# Patient Record
Sex: Male | Born: 1975
Health system: Southern US, Community
[De-identification: ages and names within clinical notes are randomized; demographics above are authoritative.]

## PROBLEM LIST (undated history)

## (undated) DIAGNOSIS — L988 Other specified disorders of the skin and subcutaneous tissue: Secondary | ICD-10-CM

## (undated) DIAGNOSIS — G8929 Other chronic pain: Secondary | ICD-10-CM

## (undated) DIAGNOSIS — K5790 Diverticulosis of intestine, part unspecified, without perforation or abscess without bleeding: Secondary | ICD-10-CM

## (undated) DIAGNOSIS — M542 Cervicalgia: Secondary | ICD-10-CM

## (undated) DIAGNOSIS — E785 Hyperlipidemia, unspecified: Secondary | ICD-10-CM

## (undated) DIAGNOSIS — M549 Dorsalgia, unspecified: Secondary | ICD-10-CM

## (undated) DIAGNOSIS — E669 Obesity, unspecified: Secondary | ICD-10-CM

## (undated) DIAGNOSIS — K5792 Diverticulitis of intestine, part unspecified, without perforation or abscess without bleeding: Secondary | ICD-10-CM

## (undated) DIAGNOSIS — R200 Anesthesia of skin: Secondary | ICD-10-CM

## (undated) DIAGNOSIS — M1990 Unspecified osteoarthritis, unspecified site: Secondary | ICD-10-CM

## (undated) DIAGNOSIS — K219 Gastro-esophageal reflux disease without esophagitis: Secondary | ICD-10-CM

## (undated) DIAGNOSIS — F419 Anxiety disorder, unspecified: Secondary | ICD-10-CM

## (undated) DIAGNOSIS — S3992XA Unspecified injury of lower back, initial encounter: Secondary | ICD-10-CM

## (undated) HISTORY — DX: Anesthesia of skin: R20.0

## (undated) HISTORY — DX: Cervicalgia: M54.2

## (undated) HISTORY — DX: Other specified disorders of the skin and subcutaneous tissue: L98.8

## (undated) HISTORY — DX: Unspecified injury of lower back, initial encounter: S39.92XA

## (undated) HISTORY — DX: Dorsalgia, unspecified: M54.9

## (undated) HISTORY — DX: Hyperlipidemia, unspecified: E78.5

## (undated) HISTORY — DX: Obesity, unspecified: E66.9

## (undated) HISTORY — DX: Gastro-esophageal reflux disease without esophagitis: K21.9

## (undated) HISTORY — DX: Anxiety disorder, unspecified: F41.9

## (undated) HISTORY — DX: Unspecified osteoarthritis, unspecified site: M19.90

## (undated) HISTORY — PX: PILONIDAL CYST EXCISION: SHX744

## (undated) HISTORY — DX: Diverticulosis of intestine, part unspecified, without perforation or abscess without bleeding: K57.90

## (undated) HISTORY — PX: HERNIA REPAIR: SHX51

## (undated) HISTORY — DX: Other chronic pain: G89.29

---
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
## 1995-06-29 HISTORY — PX: CYSTECTOMY: SUR359

## 2000-06-28 HISTORY — PX: WISDOM TOOTH EXTRACTION: SHX21

## 2004-06-25 ENCOUNTER — Emergency Department (HOSPITAL_COMMUNITY): Admission: EM | Admit: 2004-06-25 | Discharge: 2004-06-25 | Payer: Self-pay | Admitting: Emergency Medicine

## 2007-05-18 ENCOUNTER — Encounter: Admission: RE | Admit: 2007-05-18 | Discharge: 2007-05-18 | Payer: Self-pay | Admitting: Internal Medicine

## 2007-06-19 ENCOUNTER — Emergency Department (HOSPITAL_COMMUNITY): Admission: EM | Admit: 2007-06-19 | Discharge: 2007-06-20 | Payer: Self-pay | Admitting: Emergency Medicine

## 2007-06-24 ENCOUNTER — Ambulatory Visit (HOSPITAL_COMMUNITY): Admission: RE | Admit: 2007-06-24 | Discharge: 2007-06-24 | Payer: Self-pay | Admitting: Emergency Medicine

## 2008-04-11 IMAGING — CT CT ABDOMEN W/ CM
2 of 6 series · 16 of 46 positions shown, 18 images · IV contrast (omnipaque)
Comparison: none

CLINICAL DATA: Fever.  Abdominal pain.  
ABDOMEN CT WITH CONTRAST:
TECHNIQUE: Multidetector CT imaging of the abdomen was performed following the standard protocol during bolus administration of intravenous contrast.
Contrast:  100 cc Omnipaque 300
TECHNIQUE: Multidetector CT imaging of the pelvis was performed following the standard protocol during bolus administration of intravenous contrast.

[Series 2: abd_pel 5.0 b40f st · axial · 0.80mm/px · z∈[-534,-108]mm · 13 of 97 slices shown, 15 images]
[im 6/97  soft-tissue]
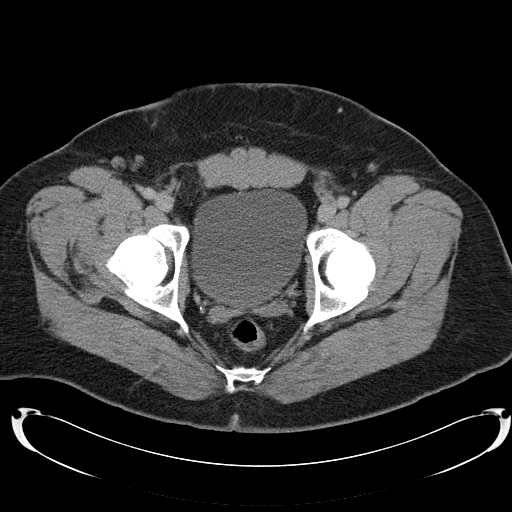
[im 6/97  bone]
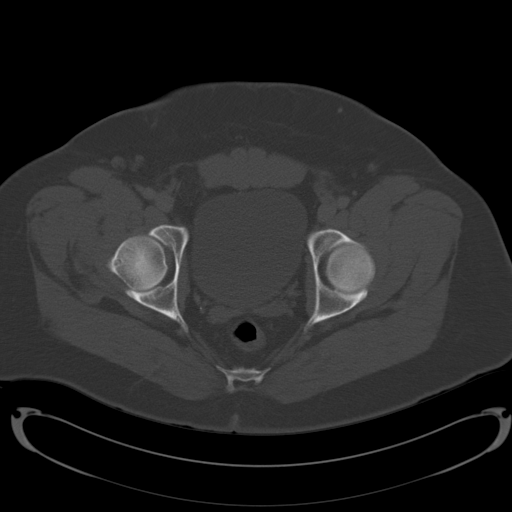
[im 12/97  soft-tissue]
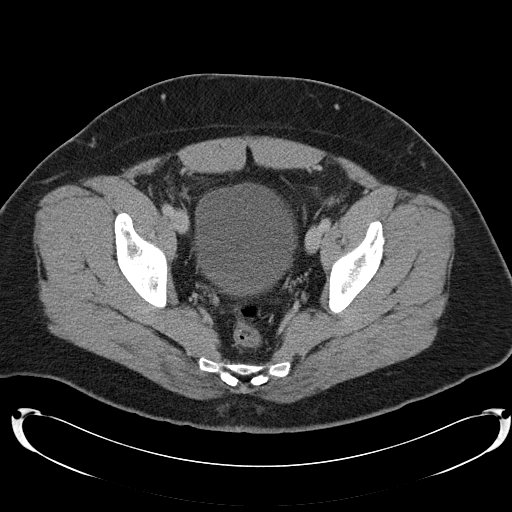
[im 23/97  soft-tissue]
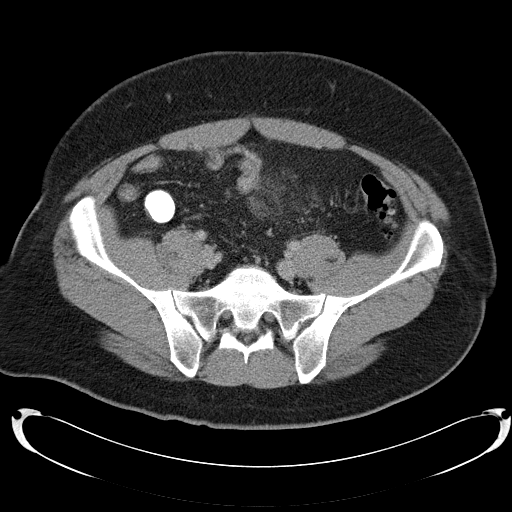
[im 29/97  soft-tissue]
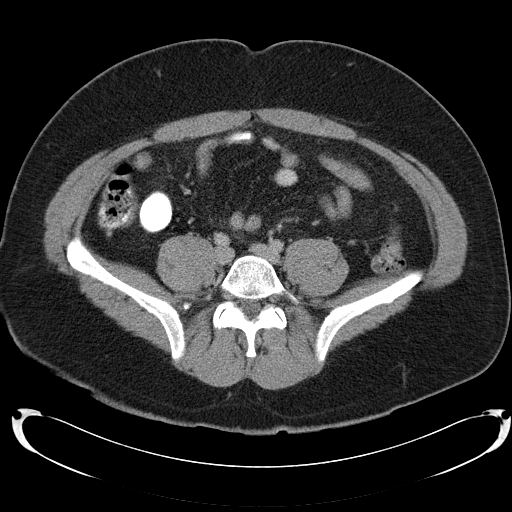
[im 34/97  soft-tissue]
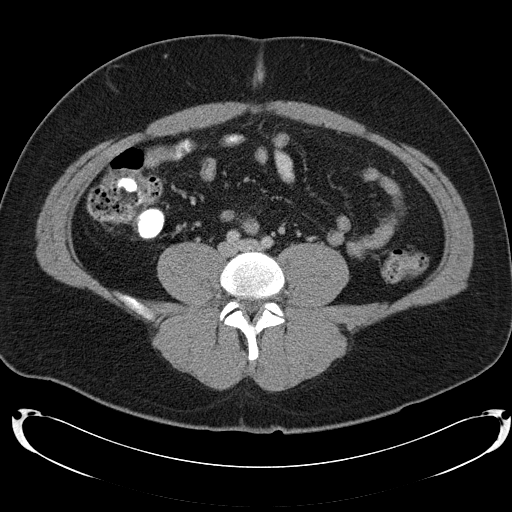
[im 40/97  soft-tissue]
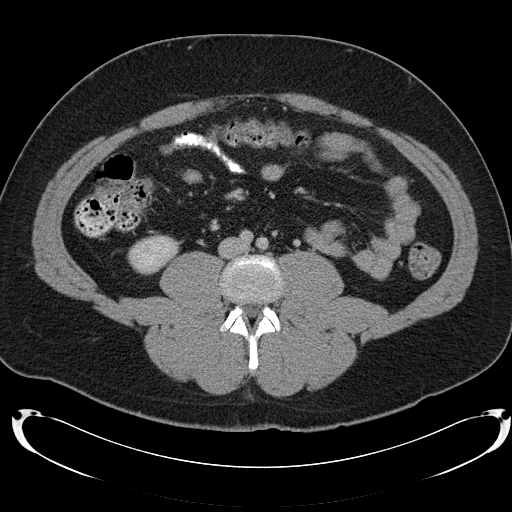
[im 51/97  soft-tissue]
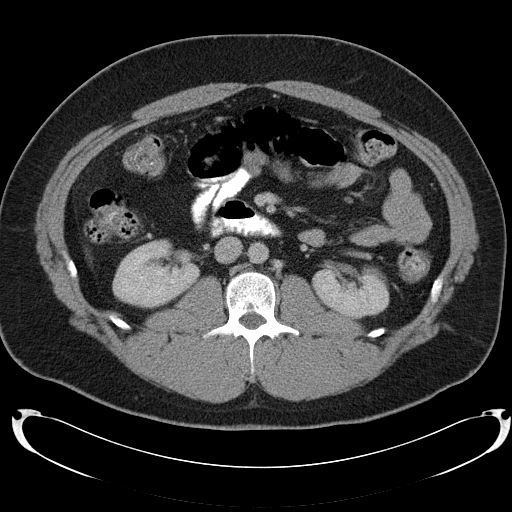
[im 57/97  soft-tissue]
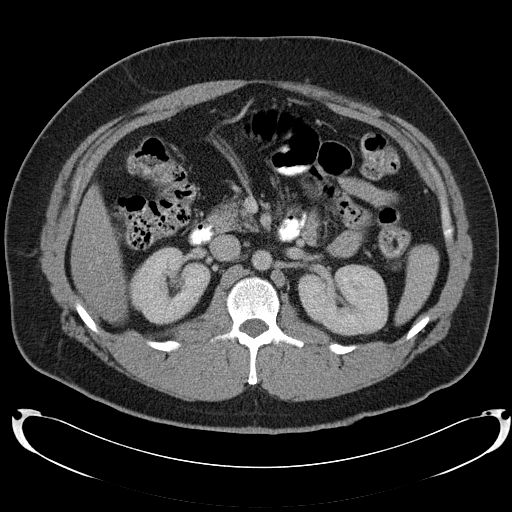
[im 63/97  soft-tissue]
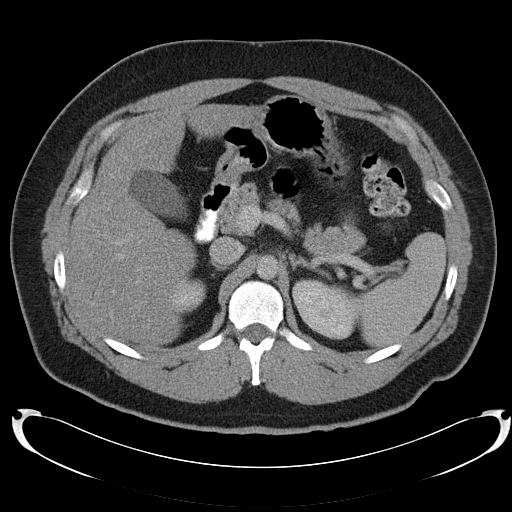
[im 63/97  bone]
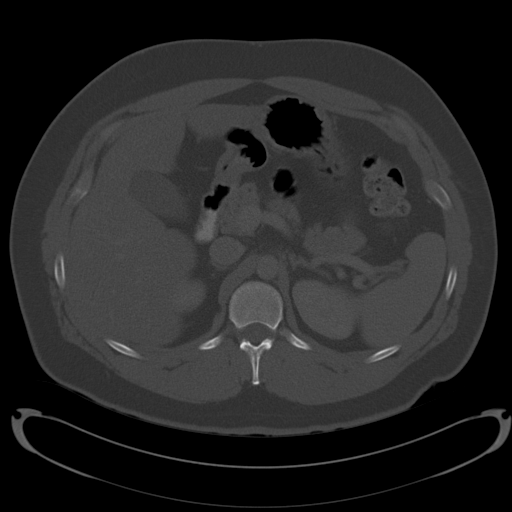
[im 68/97  soft-tissue]
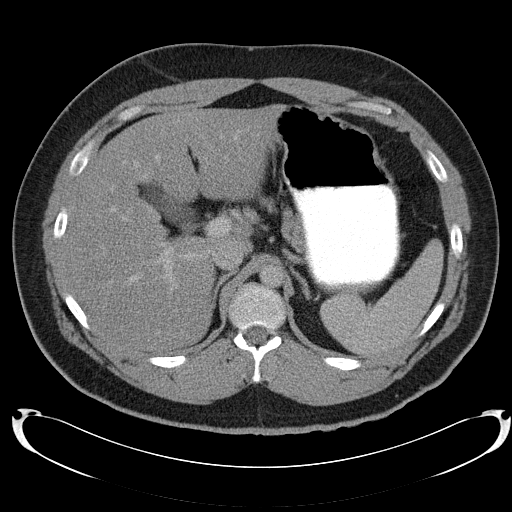
[im 74/97  soft-tissue]
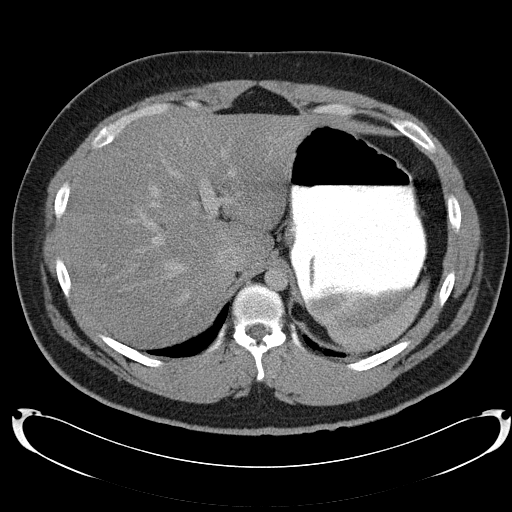
[im 85/97  soft-tissue]
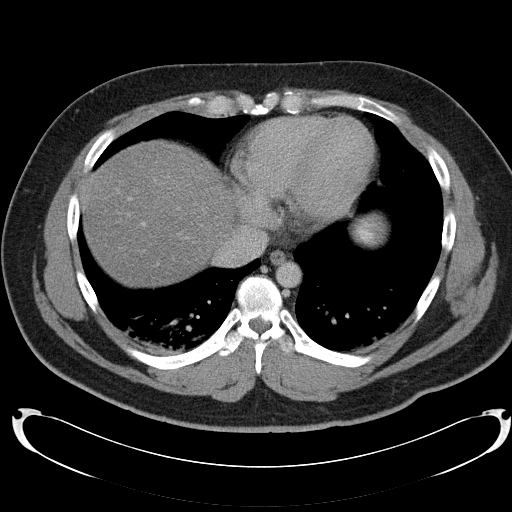
[im 91/97  soft-tissue]
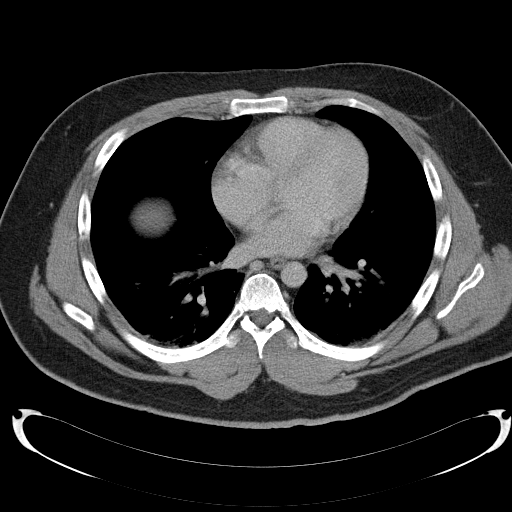

[Series 602: <mpr thick range> · coronal · 0.98mm/px · 3 of 81 slices shown]
[im 27/81  soft-tissue]
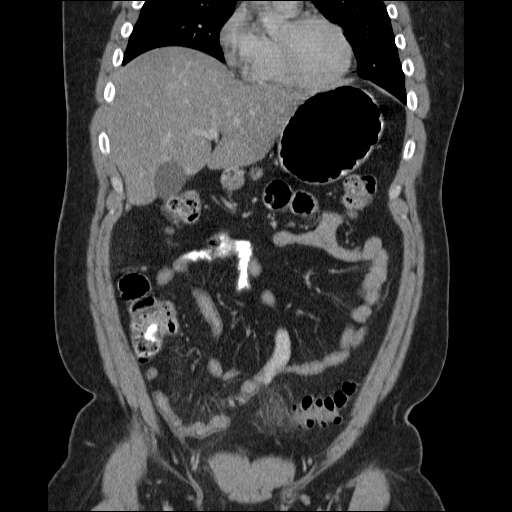
[im 36/81  soft-tissue]
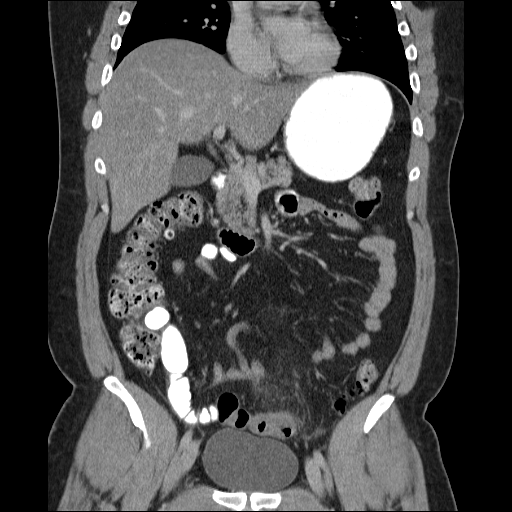
[im 45/81  soft-tissue]
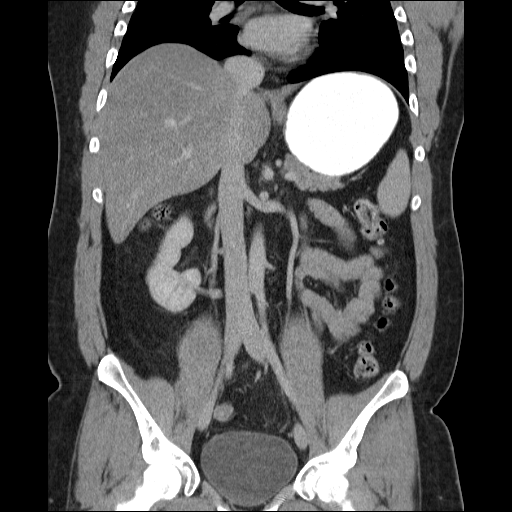

[16 of 46 positions shown; findings below may reference images not displayed]

FINDINGS: There is some dependent atelectatic changes in the lung bases, but no pleural or pericardial effusion.  Liver is diffusely low attenuating compatible with fatty infiltration.  No focal liver lesion.  The gallbladder, biliary tree, pancreas, spleen, adrenal glands, and right kidney appear normal.  There is a small exophytic lesion off the upper pole of the left kidney which measures approximately 1.2 cm in diameter.  Lesion shows Hounsfield unit measurements of 41.7 on early imaging and 58.2 on delayed imaging.  It is worrisome for a small renal neoplasm/renal cell carcinoma.  No regional lymphadenopathy is identified.  Renal vein is patent.  Stomach and small bowel are unremarkable.  No free air or abnormal fluid collection.  No focal bony abnormality.
IMPRESSION: 1.  Lesion in the upper pole of the left kidney as described above.  It may represent an atypical cyst, but renal cell carcinoma cannot be excluded and MRI is recommended for further evaluation. 
2.  Fatty liver.  
PELVIS CT WITH CONTRAST:
FINDINGS: There is thickening of the wall of the sigmoid colon with diverticular disease present.  Infiltration of fat about the sigmoid colon is consistent with diverticulitis.  There is no abscess or perforation.  Appendix is well visualized and normal.   No pelvic fluid or lymphadenopathy.  No focal bony abnormality.
IMPRESSION: Study is positive for diverticulitis without abscess or perforation.  Findings of this report were discussed Dr. Haruo Lutena at [DATE] a.m. on 06/20/07.

## 2010-10-06 ENCOUNTER — Emergency Department (HOSPITAL_COMMUNITY)
Admission: EM | Admit: 2010-10-06 | Discharge: 2010-10-06 | Disposition: A | Discharge status: Home / Self Care | Payer: 59 | Attending: Emergency Medicine | Admitting: Emergency Medicine

## 2010-10-06 DIAGNOSIS — A46 Erysipelas: Secondary | ICD-10-CM | POA: Insufficient documentation

## 2010-10-06 DIAGNOSIS — L0201 Cutaneous abscess of face: Secondary | ICD-10-CM | POA: Insufficient documentation

## 2010-10-06 DIAGNOSIS — L03211 Cellulitis of face: Secondary | ICD-10-CM | POA: Insufficient documentation

## 2011-03-30 ENCOUNTER — Encounter (INDEPENDENT_AMBULATORY_CARE_PROVIDER_SITE_OTHER): Payer: Self-pay | Admitting: Surgery

## 2011-04-01 ENCOUNTER — Ambulatory Visit (INDEPENDENT_AMBULATORY_CARE_PROVIDER_SITE_OTHER): Payer: 59 | Admitting: Surgery

## 2011-04-01 ENCOUNTER — Encounter (INDEPENDENT_AMBULATORY_CARE_PROVIDER_SITE_OTHER): Payer: Self-pay | Admitting: Surgery

## 2011-04-01 VITALS — BP 142/98 | HR 64 | Temp 97.6°F | Resp 20 | Ht 68.5 in | Wt 243.1 lb

## 2011-04-01 DIAGNOSIS — R1032 Left lower quadrant pain: Secondary | ICD-10-CM

## 2011-04-01 NOTE — Progress Notes (Signed)
Chief Complaint  Patient presents with  . Other    new pt eval of [facility]     HPI Brian Riddle is a 35 y.o. male.   HPIReferred by Dr. Pearson Grippe for evaluation of left groin pain. The patient had bilateral inguinal hernia repairs when he was a child. About 2-1/2 weeks ago he was working out in feels that he put to much weight on the machine. He strained his lower back as well as left hip. A few days later he began having some pain in his left groin. The pain resolves when he is supine and is asymptomatic in the mornings. However as the day progresses he starts to have some tenderness in his groin radiating down to the left side of his testicle. He does not feel any bulge or swelling in this area. He denies any problems with bowel movements. He has not been exercising recently due to the pain in his back and hip. Dr. Selena Batten started him on Flagyl prophylactically due to his history of diverticulitis.  Past Medical History  Diagnosis Date  . Hyperlipidemia   . GERD (gastroesophageal reflux disease)   . Arthritis   . Anxiety   . Numbness of hand     questionable carpal tunnel  . Chronic back pain   . Chronic neck pain   . Back injury 1982  . Abdominal pain     groin area around [facility] that comes and goes     Past Surgical History  Procedure Date  . Cystectomy 1997    Rt leg  . Hernia repair [id number]  . Wisdom tooth extraction 2002  . Pilonidal cyst excision [id number]    History reviewed. No pertinent family history.  Social History History  Substance Use Topics  . Smoking status: Current Some Day Smoker -- 0.5 packs/day  . Smokeless tobacco: Never Used  . Alcohol Use: Yes    Allergies  Allergen Reactions  . Penicillins     Hives, and difficulty breathing     Current Outpatient Prescriptions  Medication Sig Dispense Refill  . Ascorbic Acid (VITAMIN C) 100 MG tablet Take 100 mg by mouth daily.        Marland Kitchen aspirin 81 MG tablet Take 81 mg by mouth daily.        . B Complex  Vitamins (B-COMPLEX/B-12 PO) Take by mouth.        Marland Kitchen LORazepam (ATIVAN) 0.5 MG tablet Take 0.5 mg by mouth as needed.        . metroNIDAZOLE (FLAGYL) 500 MG tablet       . Multiple Vitamins-Minerals (MULTIVITAMIN WITH MINERALS) tablet Take 1 tablet by mouth daily.        . Nutritional Supplements (ACID BLOCKERS DEPLETION PO) Take by mouth.        . Acetaminophen (TYLENOL ARTHRITIS PAIN PO) Take by mouth.        . Pseudoephedrine HCl (SUDAFED 12 HOUR PO) Take by mouth.          Review of Systems Review of Systems ROS positive for arthritis in lower back, left hip bursitis Blood pressure 142/98, pulse 64, temperature 97.6 F (36.4 C), resp. rate 20, height 5' 8.5" (1.74 m), weight 243 lb 2 oz (110.281 kg).  Physical Exam Physical Exam WDWN in NAD HEENT:  EOMI, sclera anicteric Neck:  No masses, no thyromegaly Lungs:  CTA bilaterally; normal respiratory effort CV:  Regular rate and rhythm; no murmurs Abd:  +bowel sounds, soft, non-tender, no masses GU:  Healed incisions bilaterally; bilateral descended testes - no testicular masses; no palpable hernias on Valsalva maneuver while standing Ext:  Well-perfused; no edema Skin:  Warm, dry; no sign of jaundice  Data Reviewed None  Assessment    Left groin pain - likely due to muscle strain, but no sign of inguinal hernia    Plan    Rest, NSAID's Return PRN       Keilyn Haggard K. 04/01/2011, 10:46 AM

## 2011-04-01 NOTE — Patient Instructions (Signed)
Avoid strenuous exercise/ heavy lifting for 2-3 weeks.  Use plenty of NSAID's (Aleve, Ibuprofen, or Naprosyn).  Call us back for a follow-up if no improvement.

## 2011-04-02 LAB — COMPREHENSIVE METABOLIC PANEL
ALT: 38
AST: 19
Albumin: 4
Alkaline Phosphatase: 57
BUN: 6
CO2: 24
Calcium: 9.2
Chloride: 106
Creatinine, Ser: 1.04
GFR calc Af Amer: >60
GFR calc non Af Amer: >60
Glucose, Bld: 122 — ABNORMAL HIGH
Potassium: 3.5
Sodium: 139
Total Bilirubin: 1.1
Total Protein: 6.7

## 2011-04-02 LAB — URINALYSIS, ROUTINE W REFLEX MICROSCOPIC
Bilirubin Urine: NEGATIVE
Glucose, UA: NEGATIVE
Hgb urine dipstick: NEGATIVE
Ketones, ur: NEGATIVE
Nitrite: NEGATIVE
Protein, ur: NEGATIVE
Specific Gravity, Urine: 1.018
Urobilinogen, UA: 0.2
pH: 7

## 2011-04-02 LAB — DIFFERENTIAL
Basophils Absolute: 0
Basophils Relative: 0
Eosinophils Absolute: 0 — ABNORMAL LOW
Eosinophils Relative: 0
Lymphocytes Relative: 13
Lymphs Abs: 2
Monocytes Absolute: 1.4 — ABNORMAL HIGH
Monocytes Relative: 9
Neutro Abs: 12 — ABNORMAL HIGH
Neutrophils Relative %: 78 — ABNORMAL HIGH

## 2011-04-02 LAB — CBC
HCT: 43.5
Hemoglobin: 15.4
MCHC: 35.4
MCV: 88.3
Platelets: 205
RBC: 4.93
RDW: 13
WBC: 15.4 — ABNORMAL HIGH

## 2011-05-06 ENCOUNTER — Ambulatory Visit: Payer: 59 | Attending: Internal Medicine | Admitting: Rehabilitative and Restorative Service Providers"

## 2011-05-06 DIAGNOSIS — M545 Low back pain, unspecified: Secondary | ICD-10-CM | POA: Insufficient documentation

## 2011-05-06 DIAGNOSIS — IMO0001 Reserved for inherently not codable concepts without codable children: Secondary | ICD-10-CM | POA: Insufficient documentation

## 2011-05-06 DIAGNOSIS — M2560 Stiffness of unspecified joint, not elsewhere classified: Secondary | ICD-10-CM | POA: Insufficient documentation

## 2011-05-06 DIAGNOSIS — M25559 Pain in unspecified hip: Secondary | ICD-10-CM | POA: Insufficient documentation

## 2011-05-17 ENCOUNTER — Ambulatory Visit: Payer: 59

## 2011-05-19 ENCOUNTER — Ambulatory Visit: Payer: 59

## 2011-05-26 ENCOUNTER — Ambulatory Visit: Payer: 59 | Admitting: Rehabilitative and Restorative Service Providers"

## 2011-05-27 ENCOUNTER — Ambulatory Visit: Payer: 59 | Admitting: Rehabilitative and Restorative Service Providers"

## 2011-06-01 ENCOUNTER — Ambulatory Visit: Payer: 59 | Attending: Internal Medicine | Admitting: Rehabilitative and Restorative Service Providers"

## 2011-06-01 DIAGNOSIS — M2560 Stiffness of unspecified joint, not elsewhere classified: Secondary | ICD-10-CM | POA: Insufficient documentation

## 2011-06-01 DIAGNOSIS — M25559 Pain in unspecified hip: Secondary | ICD-10-CM | POA: Insufficient documentation

## 2011-06-01 DIAGNOSIS — M545 Low back pain, unspecified: Secondary | ICD-10-CM | POA: Insufficient documentation

## 2011-06-01 DIAGNOSIS — IMO0001 Reserved for inherently not codable concepts without codable children: Secondary | ICD-10-CM | POA: Insufficient documentation

## 2011-06-03 ENCOUNTER — Ambulatory Visit: Payer: 59 | Admitting: Rehabilitative and Restorative Service Providers"

## 2011-06-08 ENCOUNTER — Ambulatory Visit: Payer: 59 | Admitting: Rehabilitative and Restorative Service Providers"

## 2011-06-09 ENCOUNTER — Encounter (INDEPENDENT_AMBULATORY_CARE_PROVIDER_SITE_OTHER): Payer: Self-pay | Admitting: Internal Medicine

## 2011-06-10 ENCOUNTER — Ambulatory Visit: Payer: 59 | Admitting: Rehabilitative and Restorative Service Providers"

## 2011-06-16 ENCOUNTER — Ambulatory Visit: Payer: 59 | Admitting: Rehabilitative and Restorative Service Providers"

## 2012-04-03 ENCOUNTER — Emergency Department (HOSPITAL_COMMUNITY): Payer: 59

## 2012-04-03 ENCOUNTER — Emergency Department (HOSPITAL_COMMUNITY)
Admission: EM | Admit: 2012-04-03 | Discharge: 2012-04-03 | Disposition: A | Discharge status: Home / Self Care | Payer: 59 | Attending: Emergency Medicine | Admitting: Emergency Medicine

## 2012-04-03 ENCOUNTER — Encounter (HOSPITAL_COMMUNITY): Payer: Self-pay | Admitting: Emergency Medicine

## 2012-04-03 DIAGNOSIS — G8929 Other chronic pain: Secondary | ICD-10-CM | POA: Insufficient documentation

## 2012-04-03 DIAGNOSIS — Z79899 Other long term (current) drug therapy: Secondary | ICD-10-CM | POA: Insufficient documentation

## 2012-04-03 DIAGNOSIS — K5732 Diverticulitis of large intestine without perforation or abscess without bleeding: Secondary | ICD-10-CM | POA: Insufficient documentation

## 2012-04-03 DIAGNOSIS — K5792 Diverticulitis of intestine, part unspecified, without perforation or abscess without bleeding: Secondary | ICD-10-CM

## 2012-04-03 DIAGNOSIS — R109 Unspecified abdominal pain: Secondary | ICD-10-CM

## 2012-04-03 DIAGNOSIS — K219 Gastro-esophageal reflux disease without esophagitis: Secondary | ICD-10-CM | POA: Insufficient documentation

## 2012-04-03 DIAGNOSIS — Z7982 Long term (current) use of aspirin: Secondary | ICD-10-CM | POA: Insufficient documentation

## 2012-04-03 LAB — COMPREHENSIVE METABOLIC PANEL
ALT: 19 U/L (ref 0–53)
Alkaline Phosphatase: 48 U/L (ref 39–117)
BUN: 11 mg/dL (ref 6–23)
CO2: 25 mEq/L (ref 19–32)
Calcium: 9 mg/dL (ref 8.4–10.5)
Chloride: 102 mEq/L (ref 96–112)
Creatinine, Ser: 0.76 mg/dL (ref 0.50–1.35)
GFR calc Af Amer: >90 mL/min (ref >90)
GFR calc non Af Amer: >90 mL/min (ref >90)
Glucose, Bld: 126 mg/dL — ABNORMAL HIGH (ref 70–99)
Potassium: 3.9 mEq/L (ref 3.5–5.1)
Sodium: 138 mEq/L (ref 135–145)
Total Bilirubin: 1 mg/dL (ref 0.3–1.2)
Total Protein: 7.3 g/dL (ref 6.0–8.3)

## 2012-04-03 LAB — CBC WITH DIFFERENTIAL/PLATELET
Basophils Absolute: 0 10*3/uL (ref 0.0–0.1)
Eosinophils Absolute: 0.1 10*3/uL (ref 0.0–0.7)
Eosinophils Relative: 0 % (ref 0–5)
HCT: 44.1 % (ref 39.0–52.0)
Hemoglobin: 15.5 g/dL (ref 13.0–17.0)
Lymphocytes Relative: 9 % — ABNORMAL LOW (ref 12–46)
Lymphs Abs: 1.4 10*3/uL (ref 0.7–4.0)
MCH: 31.6 pg (ref 26.0–34.0)
MCV: 90 fL (ref 78.0–100.0)
Monocytes Absolute: 1.3 10*3/uL — ABNORMAL HIGH (ref 0.1–1.0)
Monocytes Relative: 9 % (ref 3–12)
Platelets: 172 10*3/uL (ref 150–400)
RBC: 4.9 MIL/uL (ref 4.22–5.81)
RDW: 12.8 % (ref 11.5–15.5)
WBC: 14.6 10*3/uL — ABNORMAL HIGH (ref 4.0–10.5)

## 2012-04-03 LAB — URINALYSIS, MICROSCOPIC ONLY
Bilirubin Urine: NEGATIVE
Glucose, UA: NEGATIVE mg/dL
Hgb urine dipstick: NEGATIVE
Ketones, ur: NEGATIVE mg/dL
Nitrite: NEGATIVE
Protein, ur: NEGATIVE mg/dL
Urobilinogen, UA: 0.2 mg/dL (ref 0.0–1.0)
pH: 6 (ref 5.0–8.0)

## 2012-04-03 MED ORDER — ACETAMINOPHEN 500 MG PO TABS
1000.0000 mg | ORAL_TABLET | Freq: Once | ORAL | Status: AC
  Administered 2012-04-03: 1000 mg via ORAL
  Filled 2012-04-03: qty 2

## 2012-04-03 MED ORDER — HYDROCODONE-ACETAMINOPHEN 5-325 MG PO TABS: ORAL_TABLET | ORAL | Status: DC

## 2012-04-03 MED ORDER — SODIUM CHLORIDE 0.9 % IV SOLN
1000.0000 mL | INTRAVENOUS | Status: DC
  Administered 2012-04-03: 1000 mL via INTRAVENOUS

## 2012-04-03 MED ORDER — SODIUM CHLORIDE 0.9 % IV SOLN
1000.0000 mL | Freq: Once | INTRAVENOUS | Status: AC
  Administered 2012-04-03: 1000 mL via INTRAVENOUS

## 2012-04-03 MED ORDER — ONDANSETRON HCL 4 MG/2ML IJ SOLN
4.0000 mg | Freq: Once | INTRAMUSCULAR | Status: AC
  Administered 2012-04-03: 4 mg via INTRAVENOUS
  Filled 2012-04-03: qty 2

## 2012-04-03 MED ORDER — CIPROFLOXACIN IN D5W 400 MG/200ML IV SOLN
400.0000 mg | Freq: Once | INTRAVENOUS | Status: AC
  Administered 2012-04-03: 400 mg via INTRAVENOUS
  Filled 2012-04-03: qty 200

## 2012-04-03 MED ORDER — PROMETHAZINE HCL 25 MG PO TABS: 25.0000 mg | ORAL_TABLET | Freq: Three times a day (TID) | ORAL | Status: DC | PRN

## 2012-04-03 MED ORDER — METRONIDAZOLE 500 MG PO TABS: 500.0000 mg | ORAL_TABLET | Freq: Four times a day (QID) | ORAL | Status: DC

## 2012-04-03 MED ORDER — CIPROFLOXACIN HCL 500 MG PO TABS: 500.0000 mg | ORAL_TABLET | Freq: Two times a day (BID) | ORAL | Status: DC

## 2012-04-03 MED ORDER — METRONIDAZOLE IN NACL 5-0.79 MG/ML-% IV SOLN
500.0000 mg | Freq: Once | INTRAVENOUS | Status: AC
  Administered 2012-04-03: 500 mg via INTRAVENOUS
  Filled 2012-04-03: qty 100

## 2012-04-03 MED ORDER — MORPHINE SULFATE 4 MG/ML IJ SOLN
4.0000 mg | Freq: Once | INTRAMUSCULAR | Status: AC
  Administered 2012-04-03: 4 mg via INTRAVENOUS
  Filled 2012-04-03: qty 1

## 2012-04-03 MED ORDER — IOHEXOL 300 MG/ML  SOLN
100.0000 mL | Freq: Once | INTRAMUSCULAR | Status: AC | PRN
  Administered 2012-04-03: 100 mL via INTRAVENOUS

## 2012-04-03 NOTE — ED Notes (Signed)
Pt to CT

## 2012-04-03 NOTE — ED Notes (Signed)
Dr Knapp in room 

## 2012-04-03 NOTE — ED Provider Notes (Signed)
History     CSN: [id number]  Arrival date & time 04/03/12  4098   First MD Initiated Contact with Patient 04/03/12 [phone]      Chief Complaint  Patient presents with  . Abdominal Pain    (Consider location/radiation/quality/duration/timing/severity/associated sxs/prior treatment) HPI  Patient reports yesterday morning he started getting diffuse abdominal pain but it's worse on the left than the right side. He states he has a gassy feeling. He states movement makes the pain feel worse. He has nausea without vomiting or diarrhea. He states he feels constipated and his last bowel movement was yesterday and was small mixed with a lot of mucus. He also reports she's had fever up to 100.8. Patient reports she's had a couple episodes of diverticulitis the last time was about a year ago and the last time he had to calm to the ER for evaluation was in 2008. He reports however he's never been admitted for his diverticulitis. He also states he's never been told he needed a surgical evaluation.  PCP Dr Selena Batten  Past Medical History  Diagnosis Date  . Hyperlipidemia   . GERD (gastroesophageal reflux disease)   . Arthritis   . Anxiety   . Numbness of hand     questionable carpal tunnel  . Chronic back pain   . Chronic neck pain   . Back injury 1982  . Abdominal pain     groin area around [facility] that comes and goes   diverticulitis  Past Surgical History  Procedure Date  . Cystectomy 1997    Rt leg  . Hernia repair [id number]  . Wisdom tooth extraction 2002  . Pilonidal cyst excision [id number]    History reviewed. No pertinent family history.  History  Substance Use Topics  . Smoking status: Current Some Day Smoker -- 0.5 packs/day  . Smokeless tobacco: Never Used  . Alcohol Use: Yes   employed   Review of Systems  All other systems reviewed and are negative.    Allergies  Other and Penicillins  Home Medications   Current Outpatient Rx  Name Route Sig Dispense Refill  .  VITAMIN C 1000 MG PO TABS Oral Take 1,000 mg by mouth daily.    . ASPIRIN 81 MG PO TABS Oral Take 81 mg by mouth daily.      Marland Kitchen BISMUTH SUBSALICYLATE 262 MG/15ML PO SUSP Oral Take 15 mLs by mouth every 6 (six) hours as needed. For upset stomach    . IBUPROFEN 200 MG PO TABS Oral Take 400 mg by mouth every 6 (six) hours as needed. For pain    . ADULT MULTIVITAMIN W/MINERALS CH Oral Take 1 tablet by mouth daily.    Marland Kitchen OMEPRAZOLE 20 MG PO CPDR Oral Take 20 mg by mouth daily.    Marland Kitchen VITAMIN B-12 1000 MCG PO TABS Oral Take 1,000 mcg by mouth daily.    Marland Kitchen LORAZEPAM 0.5 MG PO TABS Oral Take 0.5 mg by mouth as needed.       BP 124/75  Pulse 89  Temp 99.1 F (37.3 C) (Oral)  Resp 18  Ht 5\' 8"  (1.727 m)  Wt 243 lb (110.224 kg)  BMI 36.95 kg/m2  SpO2 98%  Vital signs normal    Physical Exam  Nursing note and vitals reviewed. Constitutional: He is oriented to person, place, and time. He appears well-developed and well-nourished.  Non-toxic appearance. He does not appear ill. No distress.  HENT:  Head: Normocephalic and atraumatic.  Right Ear: External ear  normal.  Left Ear: External ear normal.  Nose: Nose normal. No mucosal edema or rhinorrhea.  Mouth/Throat: Oropharynx is clear and moist and mucous membranes are normal. No dental abscesses or uvula swelling.  Eyes: Conjunctivae normal and EOM are normal. Pupils are equal, round, and reactive to light.  Neck: Normal range of motion and full passive range of motion without pain. Neck supple.  Cardiovascular: Normal rate, regular rhythm and normal heart sounds.  Exam reveals no gallop and no friction rub.   No murmur heard. Pulmonary/Chest: Effort normal and breath sounds normal. No respiratory distress. He has no wheezes. He has no rhonchi. He has no rales. He exhibits no tenderness and no crepitus.  Abdominal: Soft. Normal appearance and bowel sounds are normal. He exhibits no distension. There is tenderness. There is no rebound and no guarding.        Has tenderness in the left mid and lower abdomen without guarding or rebound  Musculoskeletal: Normal range of motion. He exhibits no edema and no tenderness.       Moves all extremities well.   Neurological: He is alert and oriented to person, place, and time. He has normal strength. No cranial nerve deficit.  Skin: Skin is warm, dry and intact. No rash noted. No erythema. No pallor.  Psychiatric: He has a normal mood and affect. His speech is normal and behavior is normal. His mood appears not anxious.    ED Course  Procedures (including critical care time)   Medications  Multiple Vitamin (MULTIVITAMIN WITH MINERALS) TABS (not administered)  0.9 %  sodium chloride infusion (1000 mL Intravenous New Bag/Given 04/03/12 0735)    Followed by  0.9 %  sodium chloride infusion (1000 mL Intravenous New Bag/Given 04/03/12 1059)  metroNIDAZOLE (FLAGYL) IVPB 500 mg (not administered)  morphine 4 MG/ML injection 4 mg (4 mg Intravenous Given 04/03/12 0736)  ondansetron (ZOFRAN) injection 4 mg (4 mg Intravenous Given 04/03/12 0736)  iohexol (OMNIPAQUE) 300 MG/ML solution 100 mL (100 mL Intravenous Contrast Given 04/03/12 1030)  ciprofloxacin (CIPRO) IVPB 400 mg (400 mg Intravenous Given 04/03/12 1110)     Results for orders placed during the hospital encounter of 04/03/12  CBC WITH DIFFERENTIAL      Component Value Range   WBC 14.6 (*) 4.0 - 10.5 K/uL   RBC 4.90  4.22 - 5.81 MIL/uL   Hemoglobin 15.5  13.0 - 17.0 g/dL   HCT 47.8  29.5 - 62.1 %   MCV 90.0  78.0 - 100.0 fL   MCH 31.6  26.0 - 34.0 pg   MCHC 35.1  30.0 - 36.0 g/dL   RDW 30.8  65.7 - 84.6 %   Platelets 172  150 - 400 K/uL   Neutrophils Relative 81 (*) 43 - 77 %   Neutro Abs 11.9 (*) 1.7 - 7.7 K/uL   Lymphocytes Relative 9 (*) 12 - 46 %   Lymphs Abs 1.4  0.7 - 4.0 K/uL   Monocytes Relative 9  3 - 12 %   Monocytes Absolute 1.3 (*) 0.1 - 1.0 K/uL   Eosinophils Relative 0  0 - 5 %   Eosinophils Absolute 0.1  0.0 - 0.7 K/uL    Basophils Relative 0  0 - 1 %   Basophils Absolute 0.0  0.0 - 0.1 K/uL  COMPREHENSIVE METABOLIC PANEL      Component Value Range   Sodium 138  135 - 145 mEq/L   Potassium 3.9  3.5 - 5.1 mEq/L  Chloride 102  96 - 112 mEq/L   CO2 25  19 - 32 mEq/L   Glucose, Bld 126 (*) 70 - 99 mg/dL   BUN 11  6 - 23 mg/dL   Creatinine, Ser 6.44  0.50 - 1.35 mg/dL   Calcium 9.0  8.4 - 03.4 mg/dL   Total Protein 7.3  6.0 - 8.3 g/dL   Albumin 3.9  3.5 - 5.2 g/dL   AST 13  0 - 37 U/L   ALT 19  0 - 53 U/L   Alkaline Phosphatase 48  39 - 117 U/L   Total Bilirubin 1.0  0.3 - 1.2 mg/dL   GFR calc non Af Amer >90  >90 mL/min   GFR calc Af Amer >90  >90 mL/min  LIPASE, BLOOD      Component Value Range   Lipase 25  11 - 59 U/L  URINALYSIS, MICROSCOPIC ONLY      Component Value Range   Color, Urine YELLOW  YELLOW   APPearance CLEAR  CLEAR   Specific Gravity, Urine 1.024  1.005 - 1.030   pH 6.0  5.0 - 8.0   Glucose, UA NEGATIVE  NEGATIVE mg/dL   Hgb urine dipstick NEGATIVE  NEGATIVE   Bilirubin Urine NEGATIVE  NEGATIVE   Ketones, ur NEGATIVE  NEGATIVE mg/dL   Protein, ur NEGATIVE  NEGATIVE mg/dL   Urobilinogen, UA 0.2  0.0 - 1.0 mg/dL   Nitrite NEGATIVE  NEGATIVE   Leukocytes, UA NEGATIVE  NEGATIVE   WBC, UA 0-2  <3 WBC/hpf   Bacteria, UA RARE  RARE   Squamous Epithelial / LPF RARE  RARE   Laboratory interpretation all normal except leukocytosis    Ct Abdomen Pelvis W Contrast  04/03/2012  *RADIOLOGY REPORT*  Clinical Data: Left lower quadrant pain, nausea  CT ABDOMEN AND PELVIS WITH CONTRAST  Technique:  Multidetector CT imaging of the abdomen and pelvis was performed following the standard protocol during bolus administration of intravenous contrast.  Contrast: OMNIPAQUE IOHEXOL 300 MG/ML  SOLN  Comparison: MRI abdomen dated 06/24/2007.  CT abdomen pelvis dated 06/20/2007.  Findings: Dependent atelectasis at the lung bases.  Liver, spleen, pancreas, and adrenal glands are within normal  limits.  Gallbladder is unremarkable.  No intrahepatic or extrahepatic ductal dilatation.  2.0 x 1.9 cm medial left upper pole cyst (series 2/image 31).  3 mm nonobstructing left renal calculus (series 2/image 43).  No hydronephrosis.  No evidence of bowel obstruction.  Normal appendix.  Colonic inflammatory changes involving the descending colon (series 2/image 56), likely reflecting diverticulitis.  No drainable fluid collection/abscess.  No free air.  No evidence of abdominal aortic aneurysm.  No abdominopelvic ascites.  No suspicious abdominopelvic lymphadenopathy.  Prostate is unremarkable.  Bladder is within normal limits.  Tiny fat-containing right inguinal hernia.  Mild degenerative changes of the visualized thoracolumbar spine.  IMPRESSION: Diverticulitis involving the descending colon.  No drainable fluid collection or abscess.  No free air.   Original Report Authenticated By: Charline Bills, M.D.      1. Abdominal pain   2. Diverticulitis     New Prescriptions   CIPROFLOXACIN (CIPRO) 500 MG TABLET    Take 1 tablet (500 mg total) by mouth 2 (two) times daily.   HYDROCODONE-ACETAMINOPHEN (NORCO/VICODIN) 5-325 MG PER TABLET    Take 1 or 2 po Q 6hrs for pain   METRONIDAZOLE (FLAGYL) 500 MG TABLET    Take 1 tablet (500 mg total) by mouth 4 (four) times daily.  PROMETHAZINE (PHENERGAN) 25 MG TABLET    Take 1 tablet (25 mg total) by mouth every 8 (eight) hours as needed for nausea.    .Plan discharge  Devoria Albe, MD, FACEP   MDM          Ward Givens, MD 04/03/12 [phone]

## 2012-04-03 NOTE — ED Notes (Signed)
Patient states abd pain starting 04/02/12 in the AM. Nausea and fever starting later in the evening @2000 . Patient took 800mg  ibuprofen @ 1000-1100, and an additional 400mg  1800. Patient reports temp of 100.8 F at @0545  on 04/03/2012. Abd pain 2-3/10.

## 2012-05-10 ENCOUNTER — Other Ambulatory Visit: Payer: Self-pay | Admitting: Internal Medicine

## 2012-05-10 DIAGNOSIS — M542 Cervicalgia: Secondary | ICD-10-CM

## 2012-05-14 ENCOUNTER — Ambulatory Visit
Admission: RE | Admit: 2012-05-14 | Discharge: 2012-05-14 | Disposition: A | Discharge status: Home / Self Care | Payer: 59 | Source: Ambulatory Visit | Attending: Internal Medicine | Admitting: Internal Medicine

## 2012-05-14 DIAGNOSIS — M542 Cervicalgia: Secondary | ICD-10-CM

## 2013-05-26 ENCOUNTER — Emergency Department (HOSPITAL_COMMUNITY)
Admission: EM | Admit: 2013-05-26 | Discharge: 2013-05-27 | Disposition: A | Discharge status: Home / Self Care | Payer: PRIVATE HEALTH INSURANCE | Attending: Emergency Medicine | Admitting: Emergency Medicine

## 2013-05-26 ENCOUNTER — Emergency Department (HOSPITAL_COMMUNITY): Payer: PRIVATE HEALTH INSURANCE

## 2013-05-26 ENCOUNTER — Encounter (HOSPITAL_COMMUNITY): Payer: Self-pay | Admitting: Emergency Medicine

## 2013-05-26 DIAGNOSIS — Z862 Personal history of diseases of the blood and blood-forming organs and certain disorders involving the immune mechanism: Secondary | ICD-10-CM | POA: Insufficient documentation

## 2013-05-26 DIAGNOSIS — F17200 Nicotine dependence, unspecified, uncomplicated: Secondary | ICD-10-CM | POA: Insufficient documentation

## 2013-05-26 DIAGNOSIS — Z8639 Personal history of other endocrine, nutritional and metabolic disease: Secondary | ICD-10-CM | POA: Insufficient documentation

## 2013-05-26 DIAGNOSIS — Z79899 Other long term (current) drug therapy: Secondary | ICD-10-CM | POA: Insufficient documentation

## 2013-05-26 DIAGNOSIS — M129 Arthropathy, unspecified: Secondary | ICD-10-CM | POA: Insufficient documentation

## 2013-05-26 DIAGNOSIS — K219 Gastro-esophageal reflux disease without esophagitis: Secondary | ICD-10-CM | POA: Insufficient documentation

## 2013-05-26 DIAGNOSIS — G8929 Other chronic pain: Secondary | ICD-10-CM | POA: Insufficient documentation

## 2013-05-26 DIAGNOSIS — Z792 Long term (current) use of antibiotics: Secondary | ICD-10-CM | POA: Insufficient documentation

## 2013-05-26 DIAGNOSIS — K5732 Diverticulitis of large intestine without perforation or abscess without bleeding: Secondary | ICD-10-CM | POA: Insufficient documentation

## 2013-05-26 DIAGNOSIS — K5792 Diverticulitis of intestine, part unspecified, without perforation or abscess without bleeding: Secondary | ICD-10-CM

## 2013-05-26 DIAGNOSIS — Z880 Allergy status to penicillin: Secondary | ICD-10-CM | POA: Insufficient documentation

## 2013-05-26 DIAGNOSIS — F411 Generalized anxiety disorder: Secondary | ICD-10-CM | POA: Insufficient documentation

## 2013-05-26 DIAGNOSIS — Z7982 Long term (current) use of aspirin: Secondary | ICD-10-CM | POA: Insufficient documentation

## 2013-05-26 HISTORY — DX: Diverticulitis of intestine, part unspecified, without perforation or abscess without bleeding: K57.92

## 2013-05-26 LAB — CBC WITH DIFFERENTIAL/PLATELET
Eosinophils Relative: 1 % (ref 0–5)
HCT: 44.4 % (ref 39.0–52.0)
Lymphocytes Relative: 24 % (ref 12–46)
Lymphs Abs: 3 10*3/uL (ref 0.7–4.0)
MCH: 31.8 pg (ref 26.0–34.0)
MCV: 91 fL (ref 78.0–100.0)
Monocytes Absolute: 1.2 10*3/uL — ABNORMAL HIGH (ref 0.1–1.0)
RDW: 13 % (ref 11.5–15.5)
WBC: 12.6 10*3/uL — ABNORMAL HIGH (ref 4.0–10.5)

## 2013-05-26 LAB — URINALYSIS, ROUTINE W REFLEX MICROSCOPIC
Leukocytes, UA: NEGATIVE
Protein, ur: NEGATIVE mg/dL
Urobilinogen, UA: 0.2 mg/dL (ref 0.0–1.0)

## 2013-05-26 LAB — COMPREHENSIVE METABOLIC PANEL
CO2: 24 mEq/L (ref 19–32)
Calcium: 9 mg/dL (ref 8.4–10.5)
Creatinine, Ser: 0.7 mg/dL (ref 0.50–1.35)
GFR calc Af Amer: >90 mL/min (ref >90)
GFR calc non Af Amer: >90 mL/min (ref >90)
Glucose, Bld: 86 mg/dL (ref 70–99)

## 2013-05-26 LAB — OCCULT BLOOD, POC DEVICE: Fecal Occult Bld: NEGATIVE

## 2013-05-26 LAB — LIPASE, BLOOD: Lipase: 32 U/L (ref 11–59)

## 2013-05-26 MED ORDER — SODIUM CHLORIDE 0.9 % IV BOLUS (SEPSIS)
1000.0000 mL | Freq: Once | INTRAVENOUS | Status: AC
  Administered 2013-05-26: 1000 mL via INTRAVENOUS

## 2013-05-26 MED ORDER — IOHEXOL 300 MG/ML  SOLN
50.0000 mL | Freq: Once | INTRAMUSCULAR | Status: AC | PRN
  Administered 2013-05-26: 50 mL via ORAL

## 2013-05-26 MED ORDER — IOHEXOL 300 MG/ML  SOLN: 50.0000 mL | Freq: Once | INTRAMUSCULAR | Status: AC | PRN

## 2013-05-26 MED ORDER — IOHEXOL 300 MG/ML  SOLN
100.0000 mL | Freq: Once | INTRAMUSCULAR | Status: AC | PRN
  Administered 2013-05-26: 100 mL via INTRAVENOUS

## 2013-05-26 MED ORDER — HYDROCODONE-ACETAMINOPHEN 5-325 MG PO TABS: 1.0000 | ORAL_TABLET | Freq: Four times a day (QID) | ORAL | Status: DC | PRN

## 2013-05-26 MED ORDER — CIPROFLOXACIN HCL 500 MG PO TABS: 500.0000 mg | ORAL_TABLET | Freq: Two times a day (BID) | ORAL | Status: DC

## 2013-05-26 MED ORDER — CIPROFLOXACIN HCL 500 MG PO TABS
500.0000 mg | ORAL_TABLET | Freq: Once | ORAL | Status: AC
Start: 2013-05-26 — End: 2013-05-26
  Administered 2013-05-26: 500 mg via ORAL
  Filled 2013-05-26: qty 1

## 2013-05-26 MED ORDER — CLINDAMYCIN HCL 150 MG PO CAPS: 150.0000 mg | ORAL_CAPSULE | Freq: Four times a day (QID) | ORAL | Status: DC

## 2013-05-26 MED ORDER — CLINDAMYCIN HCL 300 MG PO CAPS
300.0000 mg | ORAL_CAPSULE | Freq: Once | ORAL | Status: AC
  Administered 2013-05-26: 300 mg via ORAL
  Filled 2013-05-26: qty 1

## 2013-05-26 MED ORDER — ONDANSETRON HCL 4 MG/2ML IJ SOLN
4.0000 mg | Freq: Once | INTRAMUSCULAR | Status: AC
  Administered 2013-05-26: 4 mg via INTRAVENOUS
  Filled 2013-05-26: qty 2

## 2013-05-26 MED ORDER — PROMETHAZINE HCL 25 MG PO TABS: 25.0000 mg | ORAL_TABLET | Freq: Four times a day (QID) | ORAL | Status: DC | PRN

## 2013-05-26 MED ORDER — MORPHINE SULFATE 4 MG/ML IJ SOLN
6.0000 mg | Freq: Once | INTRAMUSCULAR | Status: AC
  Administered 2013-05-26: 6 mg via INTRAVENOUS
  Filled 2013-05-26: qty 2

## 2013-05-26 NOTE — ED Provider Notes (Signed)
CSN: [id number]     Arrival date & time 05/26/13  2005 History   First MD Initiated Contact with Patient 05/26/13 2021     Chief Complaint  Patient presents with  . Abdominal Pain  . Nausea   (Consider location/radiation/quality/duration/timing/severity/associated sxs/prior Treatment) HPI  37 year old male with history of diverticulitis,  GERD presents complaining of abdominal pain. Patient reports gradual onset of left lower quadrant abdominal pain which started a day ago. Pain was initially intermittent but now has been progressively worse and persistent. Pain is described as a crampy sensation with occasional sharp stabbing pain, nonradiating. Pain is currently 6/10 but moved to an 8/10 with movement or with palpation. Patient does nausea without vomiting. He notice small amount of stools with mucus but no blood. Reports subjective fever and chills. Denies headache, chest pain, shortness of breath, productive cough, back pain, dysuria, hematuria, scrotal swelling, penile pain, or rash. States symptoms felt very similar to prior diverticulitis which he was diagnosed last year. Patient reports being allergic to metronidazole. Patient has prior inguinal hernia repair when he was a child.  Denies having any evidence of hernia.    Past Medical History  Diagnosis Date  . Hyperlipidemia   . GERD (gastroesophageal reflux disease)   . Arthritis   . Anxiety   . Numbness of hand     questionable carpal tunnel  . Chronic back pain   . Chronic neck pain   . Back injury 1982  . Abdominal pain     groin area around [facility] that comes and goes   . Diverticulitis    Past Surgical History  Procedure Laterality Date  . Cystectomy  1997    Rt leg  . Hernia repair  [id number]  . Wisdom tooth extraction  2002  . Pilonidal cyst excision  [id number]   No family history on file. History  Substance Use Topics  . Smoking status: Current Every Day Smoker -- 0.50 packs/day    Types: Cigarettes  . Smokeless  tobacco: Never Used  . Alcohol Use: Yes    Review of Systems  All other systems reviewed and are negative.    Allergies  Metronidazole; Other; and Penicillins  Home Medications   Current Outpatient Rx  Name  Route  Sig  Dispense  Refill  . Ascorbic Acid (VITAMIN C) 1000 MG tablet   Oral   Take 1,000 mg by mouth daily.         Marland Kitchen aspirin 81 MG tablet   Oral   Take 81 mg by mouth daily.           Marland Kitchen bismuth subsalicylate (PEPTO BISMOL) 262 MG/15ML suspension   Oral   Take 15 mLs by mouth every 6 (six) hours as needed. For upset stomach         . ciprofloxacin (CIPRO) 500 MG tablet   Oral   Take 1 tablet (500 mg total) by mouth 2 (two) times daily.   20 tablet   0   . HYDROcodone-acetaminophen (NORCO/VICODIN) 5-325 MG per tablet      Take 1 or 2 po Q 6hrs for pain   16 tablet   0   . ibuprofen (ADVIL,MOTRIN) 200 MG tablet   Oral   Take 400 mg by mouth every 6 (six) hours as needed. For pain         . LORazepam (ATIVAN) 0.5 MG tablet   Oral   Take 0.5 mg by mouth as needed.          Marland Kitchen  metroNIDAZOLE (FLAGYL) 500 MG tablet   Oral   Take 1 tablet (500 mg total) by mouth 4 (four) times daily.   40 tablet   0   . Multiple Vitamin (MULTIVITAMIN WITH MINERALS) TABS   Oral   Take 1 tablet by mouth daily.         Marland Kitchen omeprazole (PRILOSEC) 20 MG capsule   Oral   Take 20 mg by mouth daily.         . promethazine (PHENERGAN) 25 MG tablet   Oral   Take 1 tablet (25 mg total) by mouth every 8 (eight) hours as needed for nausea.   8 tablet   0   . vitamin B-12 (CYANOCOBALAMIN) 1000 MCG tablet   Oral   Take 1,000 mcg by mouth daily.          BP 130/85  Pulse 69  Temp(Src) 98.9 F (37.2 C) (Oral)  Resp 20  Ht 5\' 9"  (1.753 m)  Wt 235 lb (106.595 kg)  BMI 34.69 kg/m2  SpO2 97% Physical Exam  Constitutional: He is oriented to person, place, and time. He appears well-developed and well-nourished. No distress.  HENT:  Head: Atraumatic.  Eyes:  Conjunctivae are normal.  Neck: Normal range of motion. Neck supple.  Cardiovascular: Normal rate and regular rhythm.   Pulmonary/Chest: Effort normal and breath sounds normal.  Abdominal: Soft. Bowel sounds are normal. He exhibits no distension and no mass. There is tenderness (Left lower quadrant abdominal tenderness with palpation. Guarding but without rebound tenderness. No hernia noted.). There is guarding. There is no rebound.  Neurological: He is alert and oriented to person, place, and time.  Skin: No rash noted.  Psychiatric: He has a normal mood and affect.    ED Course  Procedures (including critical care time)  11:49 PM Patient with history of diverticulitis presents with left lower quadrant abdominal pain similar to prior diverticulitis. CT scan did confirm evidence of diverticulitis. Patient has an elevated white count. He is currently afebrile and not actively vomiting. He is able to tolerate by mouth. Patient has allergic reaction to metronidazole and the past, we'll treat with Cipro and clindamycin. Care discussed with attending. Return precautions discussed. Patient will also followup with PCP for further care.  Labs Review Labs Reviewed  CBC WITH DIFFERENTIAL - Abnormal; Notable for the following:    WBC 12.6 (*)    Neutro Abs 8.3 (*)    Monocytes Absolute 1.2 (*)    All other components within normal limits  COMPREHENSIVE METABOLIC PANEL  LIPASE, BLOOD  URINALYSIS, ROUTINE W REFLEX MICROSCOPIC  OCCULT BLOOD, POC DEVICE   Imaging Review Ct Abdomen Pelvis W Contrast  05/26/2013   CLINICAL DATA:  37 year old male with left abdominal and pelvic pain.  EXAM: CT ABDOMEN AND PELVIS WITH CONTRAST  TECHNIQUE: Multidetector CT imaging of the abdomen and pelvis was performed using the standard protocol following bolus administration of intravenous contrast.  CONTRAST:  OMNIPAQUE IOHEXOL 300 MG/ML  SOLN  COMPARISON:  04/03/2012 and 06/20/2007 CTs  FINDINGS: The lung bases  are clear.  Fatty infiltration of the liver again noted.  The spleen, adrenal glands, gallbladder, right kidney and pancreas are unremarkable.  Nonobstructing left renal calculi and a left renal cyst again noted.  Focal wall thickening of the colon at the descending/sigmoid junction is noted with adjacent inflammation - compatible with diverticulitis.  There is no evidence of pneumoperitoneum, abscess or bowel obstruction.  The appendix and bladder are unremarkable.  There  is no evidence of free fluid, enlarged lymph nodes, biliary dilation or abdominal aortic aneurysm.  No acute or suspicious bony abnormalities are identified.  IMPRESSION: Diverticulitis at the descending-sigmoid colon junction. No evidence of focal abscess or pneumoperitoneum.  Fatty infiltration of the liver.  Nonobstructing left renal calculi.   Electronically Signed   By: Laveda Abbe M.D.   On: 05/26/2013 22:45    EKG Interpretation   None       MDM   1. Diverticulitis    BP 130/85  Pulse 69  Temp(Src) 98.9 F (37.2 C) (Oral)  Resp 20  Ht 5\' 9"  (1.753 m)  Wt 235 lb (106.595 kg)  BMI 34.69 kg/m2  SpO2 97%  I have reviewed nursing notes and vital signs. I personally reviewed the imaging tests through PACS system  I reviewed available ER/hospitalization records thought the EMR     Fayrene Helper, PA-C 05/26/13 2350

## 2013-05-26 NOTE — ED Notes (Signed)
Pt states that he has a hx of diverticulitis and feels like he is having a flare up. Pt states he has had LLQ ABD pain since last night. Alos, c/o nausea and some constipation, although, he states he has passed some mucous.

## 2013-05-27 NOTE — ED Provider Notes (Signed)
Medical screening examination/treatment/procedure(s) were performed by non-physician practitioner and as supervising physician I was immediately available for consultation/collaboration.  EKG Interpretation   None        Chadd Tollison T Severn Goddard, MD 05/27/13 1520 

## 2014-08-25 ENCOUNTER — Encounter (HOSPITAL_COMMUNITY): Payer: Self-pay | Admitting: Emergency Medicine

## 2014-08-25 ENCOUNTER — Emergency Department (HOSPITAL_COMMUNITY)
Admission: EM | Admit: 2014-08-25 | Discharge: 2014-08-25 | Disposition: A | Discharge status: Home / Self Care | Payer: PRIVATE HEALTH INSURANCE | Attending: Emergency Medicine | Admitting: Emergency Medicine

## 2014-08-25 ENCOUNTER — Emergency Department (HOSPITAL_COMMUNITY): Payer: PRIVATE HEALTH INSURANCE

## 2014-08-25 DIAGNOSIS — Z880 Allergy status to penicillin: Secondary | ICD-10-CM | POA: Diagnosis not present

## 2014-08-25 DIAGNOSIS — Z906 Acquired absence of other parts of urinary tract: Secondary | ICD-10-CM | POA: Diagnosis not present

## 2014-08-25 DIAGNOSIS — M1990 Unspecified osteoarthritis, unspecified site: Secondary | ICD-10-CM | POA: Diagnosis not present

## 2014-08-25 DIAGNOSIS — Z87891 Personal history of nicotine dependence: Secondary | ICD-10-CM | POA: Insufficient documentation

## 2014-08-25 DIAGNOSIS — R1032 Left lower quadrant pain: Secondary | ICD-10-CM | POA: Diagnosis present

## 2014-08-25 DIAGNOSIS — Z87828 Personal history of other (healed) physical injury and trauma: Secondary | ICD-10-CM | POA: Insufficient documentation

## 2014-08-25 DIAGNOSIS — G8929 Other chronic pain: Secondary | ICD-10-CM | POA: Insufficient documentation

## 2014-08-25 DIAGNOSIS — Z792 Long term (current) use of antibiotics: Secondary | ICD-10-CM | POA: Diagnosis not present

## 2014-08-25 DIAGNOSIS — Z8719 Personal history of other diseases of the digestive system: Secondary | ICD-10-CM | POA: Insufficient documentation

## 2014-08-25 DIAGNOSIS — Z8639 Personal history of other endocrine, nutritional and metabolic disease: Secondary | ICD-10-CM | POA: Insufficient documentation

## 2014-08-25 DIAGNOSIS — Z7982 Long term (current) use of aspirin: Secondary | ICD-10-CM | POA: Insufficient documentation

## 2014-08-25 DIAGNOSIS — Z79899 Other long term (current) drug therapy: Secondary | ICD-10-CM | POA: Insufficient documentation

## 2014-08-25 DIAGNOSIS — N200 Calculus of kidney: Secondary | ICD-10-CM | POA: Insufficient documentation

## 2014-08-25 DIAGNOSIS — F419 Anxiety disorder, unspecified: Secondary | ICD-10-CM | POA: Diagnosis not present

## 2014-08-25 LAB — URINALYSIS, ROUTINE W REFLEX MICROSCOPIC
BILIRUBIN URINE: NEGATIVE
Glucose, UA: NEGATIVE mg/dL
Ketones, ur: NEGATIVE mg/dL
Leukocytes, UA: NEGATIVE
NITRITE: NEGATIVE
PH: 5.5 (ref 5.0–8.0)
Protein, ur: 30 mg/dL — AB
SPECIFIC GRAVITY, URINE: 1.04 — AB (ref 1.005–1.030)
Urobilinogen, UA: 0.2 mg/dL (ref 0.0–1.0)

## 2014-08-25 LAB — URINE MICROSCOPIC-ADD ON

## 2014-08-25 MED ORDER — HYDROMORPHONE HCL 1 MG/ML IJ SOLN
1.0000 mg | Freq: Once | INTRAMUSCULAR | Status: AC
  Administered 2014-08-25: 1 mg via INTRAVENOUS
  Filled 2014-08-25: qty 1

## 2014-08-25 MED ORDER — OXYCODONE-ACETAMINOPHEN 5-325 MG PO TABS: 1.0000 | ORAL_TABLET | ORAL | Status: DC | PRN

## 2014-08-25 MED ORDER — ONDANSETRON HCL 4 MG/2ML IJ SOLN
4.0000 mg | Freq: Once | INTRAMUSCULAR | Status: AC
  Administered 2014-08-25: 4 mg via INTRAVENOUS
  Filled 2014-08-25: qty 2

## 2014-08-25 MED ORDER — ONDANSETRON HCL 4 MG PO TABS: 4.0000 mg | ORAL_TABLET | Freq: Four times a day (QID) | ORAL | Status: DC

## 2014-08-25 MED ORDER — HYDROMORPHONE HCL 1 MG/ML IJ SOLN
0.5000 mg | Freq: Once | INTRAMUSCULAR | Status: AC
  Administered 2014-08-25: 0.5 mg via INTRAVENOUS
  Filled 2014-08-25: qty 1

## 2014-08-25 NOTE — ED Provider Notes (Signed)
CSN: [id number]     Arrival date & time 08/25/14  0154 History   First MD Initiated Contact with Patient 08/25/14 [phone]     Chief Complaint  Patient presents with  . Abdominal Pain    LLQ, radiates to penis     (Consider location/radiation/quality/duration/timing/severity/associated sxs/prior Treatment) Patient is a 39 y.o. male presenting with abdominal pain. The history is provided by the patient.  Abdominal Pain Pain location:  LLQ (LLQ pain that radiates to the back and groin) Pain quality: pressure and sharp   Pain radiates to:  L flank and groin Pain severity:  Severe Onset quality:  Sudden Duration:  3 hours Timing:  Constant Progression:  Worsening Chronicity:  New Associated symptoms: hematuria (pt states that he was at his PCP yesterday getting a work-up for a potential UTI and was told his UA was positive for RBC. pt denies seeing blood in his urine.) and nausea   Associated symptoms: no chills, no constipation, no diarrhea, no fever and no vomiting     Past Medical History  Diagnosis Date  . Hyperlipidemia   . GERD (gastroesophageal reflux disease)   . Arthritis   . Anxiety   . Numbness of hand     questionable carpal tunnel  . Chronic back pain   . Chronic neck pain   . Back injury 1982  . Abdominal pain     groin area around [facility] that comes and goes   . Diverticulitis    Past Surgical History  Procedure Laterality Date  . Cystectomy  1997    Rt leg  . Hernia repair  [id number]  . Wisdom tooth extraction  2002  . Pilonidal cyst excision  [id number]   No family history on file. History  Substance Use Topics  . Smoking status: Former Smoker -- 0.50 packs/day    Types: Cigarettes    Quit date: 07/01/2014  . Smokeless tobacco: Never Used  . Alcohol Use: Yes     Comment: occ    Review of Systems  Constitutional: Positive for diaphoresis. Negative for fever and chills.  HENT: Positive for sinus pressure (pt states that he was recently treated for a  sinus infection. pt reports he took his last dose of z pack on Monday.).   Gastrointestinal: Positive for nausea and abdominal pain. Negative for vomiting, diarrhea, constipation and blood in stool.  Endocrine: Negative for polyuria.  Genitourinary: Positive for urgency, hematuria (pt states that he was at his PCP yesterday getting a work-up for a potential UTI and was told his UA was positive for RBC. pt denies seeing blood in his urine.), flank pain, decreased urine volume, difficulty urinating and penile pain. Negative for frequency, discharge, enuresis and genital sores.  Musculoskeletal: Positive for back pain.  Skin: Negative for color change, pallor, rash and wound.  Psychiatric/Behavioral: Negative for behavioral problems and agitation.      Allergies  Metronidazole; Other; and Penicillins  Home Medications   Prior to Admission medications   Medication Sig Start Date End Date Taking? Authorizing Provider  Ascorbic Acid (VITAMIN C) 1000 MG tablet Take 1,000 mg by mouth daily.   Yes Historical Provider, MD  aspirin 81 MG tablet Take 81 mg by mouth daily.     Yes Historical Provider, MD  bismuth subsalicylate (PEPTO BISMOL) 262 MG chewable tablet Chew 524 mg by mouth 3 (three) times daily as needed for indigestion.    Yes Historical Provider, MD  dextromethorphan (DELSYM) 30 MG/5ML liquid Take 60 mg by mouth  2 (two) times daily as needed for cough.   Yes Historical Provider, MD  ibuprofen (ADVIL,MOTRIN) 200 MG tablet Take 800 mg by mouth every 6 (six) hours as needed for moderate pain.   Yes Historical Provider, MD  LORazepam (ATIVAN) 0.5 MG tablet Take 0.5 mg by mouth every 8 (eight) hours as needed for anxiety or sleep.    Yes Historical Provider, MD  Misc Natural Products (OSTEO BI-FLEX TRIPLE STRENGTH PO) Take 2 tablets by mouth daily.   Yes Historical Provider, MD  Multiple Vitamin (MULTIVITAMIN WITH MINERALS) TABS Take 1 tablet by mouth daily.   Yes Historical Provider, MD   nicotine (NICODERM CQ - DOSED IN MG/24 HOURS) 14 mg/24hr patch Place 14 mg onto the skin daily.   Yes Historical Provider, MD  Omeprazole-Sodium Bicarbonate (ZEGERID) 20-1100 MG CAPS capsule Take 1 capsule by mouth daily as needed (heart burn).    Yes Historical Provider, MD  PE-Diphenhydramine-DM-GG-APAP (MUCINEX FAST-MAX DAY/NIGHT PO) Take 2 tablets by mouth every 6 (six) hours as needed (cough).   Yes Historical Provider, MD  pseudoephedrine (SUDAFED) 30 MG tablet Take 30 mg by mouth every 4 (four) hours as needed for congestion.   Yes Historical Provider, MD  terazosin (HYTRIN) 1 MG capsule Take 1 mg by mouth daily.   Yes Historical Provider, MD  vitamin B-12 (CYANOCOBALAMIN) 1000 MCG tablet Take 1,000 mcg by mouth daily.   Yes Historical Provider, MD  ciprofloxacin (CIPRO) 500 MG tablet Take 1 tablet (500 mg total) by mouth 2 (two) times daily. Patient not taking: Reported on 08/25/2014 05/26/13   Domenic Moras, PA-C  clindamycin (CLEOCIN) 150 MG capsule Take 1 capsule (150 mg total) by mouth every 6 (six) hours. Patient not taking: Reported on 08/25/2014 05/26/13   Domenic Moras, PA-C  HYDROcodone-acetaminophen (NORCO/VICODIN) 5-325 MG per tablet Take 1 tablet by mouth every 6 (six) hours as needed. Patient not taking: Reported on 08/25/2014 05/26/13   Domenic Moras, PA-C  promethazine (PHENERGAN) 25 MG tablet Take 1 tablet (25 mg total) by mouth every 6 (six) hours as needed for nausea. Patient not taking: Reported on 08/25/2014 05/26/13   Domenic Moras, PA-C   BP 132/90 mmHg  Pulse 82  Temp(Src) 97.7 F (36.5 C) (Oral)  Resp 16  SpO2 100% Physical Exam  Constitutional: He is oriented to person, place, and time. He appears well-developed and well-nourished. He appears distressed.  Cardiovascular: Regular rhythm and normal heart sounds.  Exam reveals no gallop and no friction rub.   No murmur heard. Pulmonary/Chest: Effort normal and breath sounds normal. No respiratory distress. He has no wheezes.  He has no rales. He exhibits no tenderness.  Abdominal: Soft. He exhibits no distension and no mass. There is tenderness. There is guarding.  Neurological: He is alert and oriented to person, place, and time.  Skin: Skin is warm. No rash noted. He is diaphoretic. No erythema. No pallor.  Psychiatric: He has a normal mood and affect. His behavior is normal. Judgment and thought content normal.    ED Course  Procedures (including critical care time) Labs Review Labs Reviewed  URINALYSIS, ROUTINE W REFLEX MICROSCOPIC - Abnormal; Notable for the following:    Specific Gravity, Urine 1.040 (*)    Hgb urine dipstick MODERATE (*)    Protein, ur 30 (*)    All other components within normal limits  URINE MICROSCOPIC-ADD ON - Abnormal; Notable for the following:    Bacteria, UA FEW (*)    All other components within normal  limits    Imaging Review No results found.   EKG Interpretation None      MDM   Final diagnoses:  None    1. Kidney stones    Patient feels better. Per appearance on CT, stone has passed into the urinary bladder. Will discharge home with prn PCP follow up.    Dewaine Oats, PA-C 08/25/14 0355  Everlene Balls, MD 08/25/14 [phone]

## 2014-08-25 NOTE — Discharge Instructions (Signed)

## 2014-08-25 NOTE — ED Notes (Signed)
Pt's d/c is finished however he is waiting on his partner to return to give him a ride home.

## 2014-08-25 NOTE — ED Notes (Signed)
Patient states he is being treated for UTI recently, patient states he was awakened suddenly with pain to LLQ, radiates to his penis, and to mid side. Patient states he has been having issues for UTI intermittently for several weeks.

## 2017-07-22 ENCOUNTER — Emergency Department (HOSPITAL_COMMUNITY): Payer: Managed Care, Other (non HMO)

## 2017-07-22 ENCOUNTER — Emergency Department (HOSPITAL_COMMUNITY)
Admission: EM | Admit: 2017-07-22 | Discharge: 2017-07-22 | Disposition: A | Discharge status: Home / Self Care | Payer: Managed Care, Other (non HMO) | Attending: Emergency Medicine | Admitting: Emergency Medicine

## 2017-07-22 ENCOUNTER — Encounter (HOSPITAL_COMMUNITY): Payer: Self-pay | Admitting: *Deleted

## 2017-07-22 DIAGNOSIS — Z87891 Personal history of nicotine dependence: Secondary | ICD-10-CM | POA: Diagnosis not present

## 2017-07-22 DIAGNOSIS — R55 Syncope and collapse: Secondary | ICD-10-CM

## 2017-07-22 DIAGNOSIS — Z7982 Long term (current) use of aspirin: Secondary | ICD-10-CM | POA: Diagnosis not present

## 2017-07-22 DIAGNOSIS — K5792 Diverticulitis of intestine, part unspecified, without perforation or abscess without bleeding: Secondary | ICD-10-CM | POA: Insufficient documentation

## 2017-07-22 DIAGNOSIS — Z79899 Other long term (current) drug therapy: Secondary | ICD-10-CM | POA: Insufficient documentation

## 2017-07-22 LAB — COMPREHENSIVE METABOLIC PANEL
ALBUMIN: 4.3 g/dL (ref 3.5–5.0)
ALT: 40 U/L (ref 17–63)
ANION GAP: 9 (ref 5–15)
AST: 30 U/L (ref 15–41)
Alkaline Phosphatase: 50 U/L (ref 38–126)
BUN: 21 mg/dL — ABNORMAL HIGH (ref 6–20)
CALCIUM: 9 mg/dL (ref 8.9–10.3)
CO2: 25 mmol/L (ref 22–32)
Chloride: 104 mmol/L (ref 101–111)
Creatinine, Ser: 0.94 mg/dL (ref 0.61–1.24)
GFR calc Af Amer: >60 mL/min (ref >60)
GFR calc non Af Amer: >60 mL/min (ref >60)
Glucose, Bld: 130 mg/dL — ABNORMAL HIGH (ref 65–99)
Potassium: 4.4 mmol/L (ref 3.5–5.1)
Sodium: 138 mmol/L (ref 135–145)
Total Bilirubin: 1.2 mg/dL (ref 0.3–1.2)
Total Protein: 7.2 g/dL (ref 6.5–8.1)

## 2017-07-22 LAB — URINALYSIS, ROUTINE W REFLEX MICROSCOPIC
BILIRUBIN URINE: NEGATIVE
Glucose, UA: NEGATIVE mg/dL
HGB URINE DIPSTICK: NEGATIVE
Ketones, ur: 20 mg/dL — AB
Leukocytes, UA: NEGATIVE
Nitrite: NEGATIVE
PH: 6 (ref 5.0–8.0)
Protein, ur: NEGATIVE mg/dL
SPECIFIC GRAVITY, URINE: 1.029 (ref 1.005–1.030)

## 2017-07-22 LAB — CBC
HEMATOCRIT: 49.2 % (ref 39.0–52.0)
HEMOGLOBIN: 17 g/dL (ref 13.0–17.0)
MCH: 31.5 pg (ref 26.0–34.0)
MCHC: 34.6 g/dL (ref 30.0–36.0)
MCV: 91.3 fL (ref 78.0–100.0)
Platelets: 164 10*3/uL (ref 150–400)
RBC: 5.39 MIL/uL (ref 4.22–5.81)
RDW: 13.5 % (ref 11.5–15.5)
WBC: 8.3 10*3/uL (ref 4.0–10.5)

## 2017-07-22 LAB — LIPASE, BLOOD: Lipase: 33 U/L (ref 11–51)

## 2017-07-22 LAB — CBG MONITORING, ED: Glucose-Capillary: 133 mg/dL — ABNORMAL HIGH (ref 65–99)

## 2017-07-22 MED ORDER — ONDANSETRON 4 MG PO TBDP: 4.0000 mg | ORAL_TABLET | Freq: Three times a day (TID) | ORAL | 0 refills | Status: DC | PRN

## 2017-07-22 MED ORDER — IOPAMIDOL (ISOVUE-300) INJECTION 61%
100.0000 mL | Freq: Once | INTRAVENOUS | Status: AC | PRN
  Administered 2017-07-22: 100 mL via INTRAVENOUS

## 2017-07-22 MED ORDER — SODIUM CHLORIDE 0.9 % IV BOLUS (SEPSIS)
1000.0000 mL | Freq: Once | INTRAVENOUS | Status: AC
  Administered 2017-07-22: 1000 mL via INTRAVENOUS

## 2017-07-22 MED ORDER — MOXIFLOXACIN HCL 400 MG PO TABS: 400.0000 mg | ORAL_TABLET | Freq: Every day | ORAL | 0 refills | Status: AC

## 2017-07-22 MED ORDER — ONDANSETRON HCL 4 MG/2ML IJ SOLN
4.0000 mg | Freq: Once | INTRAMUSCULAR | Status: AC
  Administered 2017-07-22: 4 mg via INTRAVENOUS
  Filled 2017-07-22: qty 2

## 2017-07-22 MED ORDER — IOPAMIDOL (ISOVUE-300) INJECTION 61%
INTRAVENOUS | Status: AC
  Administered 2017-07-22: 100 mL via INTRAVENOUS
  Filled 2017-07-22: qty 100

## 2017-07-22 NOTE — ED Provider Notes (Signed)
Received sign out at shift change from PA [employer].   In brief, this is a 42yo male who presents to the Emergency Department after a syncopal episode in which he fell and hit the back of his head earlier today. Had an episode of vomiting following and now has LLQ pain. He hs a history of diverticulitis.   Labs reviewed. CMP, CBC and Lipase unremarkable. UA does not appear to be infected. EKG non-ischemic.   Waiting on CT head and CT abdomen/pelvis.   CT head without acute abnormality. CT abdomen reveals questionable early changes of diverticulitis in the sigmoid colon. On recheck patient is non-toxic appearing, in no acute distress. Reports his pain is mild. Abdominal exam without guarding or rigidity. He reports he is allergic to metronidazole. Will start him on Moxifloxacin to cover for potential development of diverticulitis. Counseled patient on return precautions and he agrees and voices understanding to plan. He has no complaints prior to discharge.   Vitals:   07/22/17 0346  Pulse: 94  Resp: (!) 22  Temp: 99.2 F (37.3 C)  SpO2: 95%   [address]   Glyn Ade, PA-C 07/22/17 9983    Sherwood Gambler, MD 07/23/17 [phone]

## 2017-07-22 NOTE — ED Provider Notes (Signed)
[facility] DEPT Provider Note   CSN: [id number] Arrival date & time: 07/22/17  0326     History   Chief Complaint Chief Complaint  Patient presents with  . Emesis  . Nausea  . Abdominal Pain  . Syncopal Episode    HPI Brian Riddle is a 42 y.o. male.  Patient presents to the emergency department with a chief complaint of syncope.  He states that he began feeling nauseated this evening.  States that he was not feeling right and that he stood up.  He states that he then became lightheaded and passed out.  He states that he struck his head on the ground.  He reports having an episode of vomiting afterward.  He denies any numbness, weakness, or tingling.  He denies any chest pain or shortness of breath.  He states that he does have some mild left-sided abdominal pain, and reports that he has a history of diverticulitis.  He is concerned about this.  He is also concerned about his head injury.  He denies any other associated symptoms.  He was given Zofran, and had some improvement of his symptoms.   The history is provided by the patient. No language interpreter was used.    Past Medical History:  Diagnosis Date  . Abdominal pain    groin area around [facility] that comes and goes   . Anxiety   . Arthritis   . Back injury 1982  . Chronic back pain   . Chronic neck pain   . Diverticulitis   . GERD (gastroesophageal reflux disease)   . Hyperlipidemia   . Numbness of hand    questionable carpal tunnel    Patient Active Problem List   Diagnosis Date Noted  . Left groin pain 04/01/2011    Past Surgical History:  Procedure Laterality Date  . CYSTECTOMY  1997   Rt leg  . HERNIA REPAIR  [id number]  . PILONIDAL CYST EXCISION  [id number]  . WISDOM TOOTH EXTRACTION  2002       Home Medications    Prior to Admission medications   Medication Sig Start Date End Date Taking? Authorizing Provider  Ascorbic Acid (VITAMIN C) 1000 MG tablet Take 1,000  mg by mouth daily.    [provider]  aspirin 81 MG tablet Take 81 mg by mouth daily.      [provider]  bismuth subsalicylate (PEPTO BISMOL) 262 MG chewable tablet Chew 524 mg by mouth 3 (three) times daily as needed for indigestion.     [provider]  ciprofloxacin (CIPRO) 500 MG tablet Take 1 tablet (500 mg total) by mouth 2 (two) times daily. Patient not taking: Reported on 08/25/2014 05/26/13   Domenic Moras, PA-C  clindamycin (CLEOCIN) 150 MG capsule Take 1 capsule (150 mg total) by mouth every 6 (six) hours. Patient not taking: Reported on 08/25/2014 05/26/13   Domenic Moras, PA-C  dextromethorphan [facility]) 30 MG/5ML liquid Take 60 mg by mouth 2 (two) times daily as needed for cough.    [provider]  HYDROcodone-acetaminophen (NORCO/VICODIN) 5-325 MG per tablet Take 1 tablet by mouth every 6 (six) hours as needed. Patient not taking: Reported on 08/25/2014 05/26/13   Domenic Moras, PA-C  ibuprofen (ADVIL,MOTRIN) 200 MG tablet Take 800 mg by mouth every 6 (six) hours as needed for moderate pain.    [provider]  LORazepam (ATIVAN) 0.5 MG tablet Take 0.5 mg by mouth every 8 (eight) hours as needed for  anxiety or sleep.     [provider]  Misc Natural Products (OSTEO BI-FLEX TRIPLE STRENGTH PO) Take 2 tablets by mouth daily.    [provider]  Multiple Vitamin (MULTIVITAMIN WITH MINERALS) TABS Take 1 tablet by mouth daily.    [provider]  nicotine (NICODERM CQ - DOSED IN MG/24 HOURS) 14 mg/24hr patch Place 14 mg onto the skin daily.    [provider]  Omeprazole-Sodium Bicarbonate (ZEGERID) 20-1100 MG CAPS capsule Take 1 capsule by mouth daily as needed (heart burn).     [provider]  ondansetron (ZOFRAN) 4 MG tablet Take 1 tablet (4 mg total) by mouth every 6 (six) hours. 08/25/14   Charlann Lange, PA-C  oxyCODONE-acetaminophen (PERCOCET/ROXICET) 5-325 MG per tablet Take 1-2 tablets by mouth  every 4 (four) hours as needed for severe pain. 08/25/14   Charlann Lange, PA-C  PE-Diphenhydramine-DM-GG-APAP (MUCINEX FAST-MAX DAY/NIGHT PO) Take 2 tablets by mouth every 6 (six) hours as needed (cough).    [provider]  promethazine (PHENERGAN) 25 MG tablet Take 1 tablet (25 mg total) by mouth every 6 (six) hours as needed for nausea. Patient not taking: Reported on 08/25/2014 05/26/13   Domenic Moras, PA-C  pseudoephedrine (SUDAFED) 30 MG tablet Take 30 mg by mouth every 4 (four) hours as needed for congestion.    [provider]  terazosin (HYTRIN) 1 MG capsule Take 1 mg by mouth daily.    [provider]  vitamin B-12 (CYANOCOBALAMIN) 1000 MCG tablet Take 1,000 mcg by mouth daily.    [provider]    Family History No family history on file.  Social History Social History   Tobacco Use  . Smoking status: Former Smoker    Packs/day: 0.50    Types: Cigarettes    Last attempt to quit: 07/01/2014    Years since quitting: 3.0  . Smokeless tobacco: Never Used  Substance Use Topics  . Alcohol use: Yes    Comment: occ  . Drug use: No     Allergies   Metronidazole; Other; and Penicillins   Review of Systems Review of Systems  All other systems reviewed and are negative.    Physical Exam Updated Vital Signs Pulse 94   Temp 99.2 F (37.3 C) (Oral)   Resp (!) 22   SpO2 95%   Physical Exam  Constitutional: He is oriented to person, place, and time. He appears well-developed and well-nourished.  HENT:  Head: Normocephalic and atraumatic.  Eyes: Conjunctivae and EOM are normal. Pupils are equal, round, and reactive to light. Right eye exhibits no discharge. Left eye exhibits no discharge. No scleral icterus.  Neck: Normal range of motion. Neck supple. No JVD present.  Cardiovascular: Normal rate, regular rhythm and normal heart sounds. Exam reveals no gallop and no friction rub.  No murmur heard. Pulmonary/Chest: Effort normal and  breath sounds normal. No respiratory distress. He has no wheezes. He has no rales. He exhibits no tenderness.  Abdominal: Soft. He exhibits no distension and no mass. There is no tenderness. There is no rebound and no guarding.  Mild left lower quadrant abdominal tenderness, no other focal abdominal tenderness  Musculoskeletal: Normal range of motion. He exhibits no edema or tenderness.  Neurological: He is alert and oriented to person, place, and time.  Skin: Skin is warm and dry.  Minor abrasion to left posterior scalp, no laceration   Psychiatric: He has a normal mood and affect. His behavior is normal. Judgment and  thought content normal.  Nursing note and vitals reviewed.    ED Treatments / Results  Labs (all labs ordered are listed, but only abnormal results are displayed) Labs Reviewed  COMPREHENSIVE METABOLIC PANEL - Abnormal; Notable for the following components:      Result Value   Glucose, Bld 130 (*)    BUN 21 (*)    All other components within normal limits  CBG MONITORING, ED - Abnormal; Notable for the following components:   Glucose-Capillary 133 (*)    All other components within normal limits  LIPASE, BLOOD  CBC  URINALYSIS, ROUTINE W REFLEX MICROSCOPIC    EKG  EKG Interpretation  Date/Time:  Friday July 22 2017 03:44:19 EST Ventricular Rate:  89 PR Interval:    QRS Duration: 98 QT Interval:  338 QTC Calculation: 412 R Axis:   40 Text Interpretation:  Age not entered, assumed to be  42 years old for purpose of ECG interpretation Normal sinus rhythm Low voltage, precordial leads RSR' in V1 or V2, right VCD or RVH No old tracing to compare Confirmed by Sherwood Gambler [id number]) on 07/22/2017 4:00:51 AM       Radiology No results found.  Procedures Procedures (including critical care time)  Medications Ordered in ED Medications  sodium chloride 0.9 % bolus 1,000 mL (not administered)  ondansetron (ZOFRAN) injection 4 mg (not administered)      Initial Impression / Assessment and Plan / ED Course  I have reviewed the triage vital signs and the nursing notes.  Pertinent labs & imaging results that were available during my care of the patient were reviewed by me and considered in my medical decision making (see chart for details).    Patient with nausea followed by subsequent syncopal episode tonight.  Patient states that he stood up to go to the bathroom, felt lightheaded, passed out.  He hit his head.  He has a small abrasion on the back of his head but no laceration.  He states that he vomited after waking up.  He is now also reporting some left lower quadrant pain.  Will check CT head and CT abdomen pelvis.  He is concerned about diverticulitis given his history.  He states that this does not feel as bad, but he would like to be evaluated for this.  High suspicion for viral process and probable orthostatic/vasovagal syncope.  Patient is getting fluids.  Patient signed out to [place], [place], who will continue care.  Final Clinical Impressions(s) / ED Diagnoses   Final diagnoses:  None    ED Discharge Orders    None       Montine Circle, PA-C 07/22/17 0601    Sherwood Gambler, MD 07/22/17 [phone]

## 2017-07-22 NOTE — Discharge Instructions (Signed)
Your blood work and CT scans were reassuring.   The scan of your abdomen shows that you may have an early developing diverticulitis.   I have written you a prescription for an antibiotic if your symptoms are not improving.  I also have written you a prescription for a medicine that will help with nausea/vomiting should you need it.   Return to the ER if you have worsening abdominal pain with vomiting that will not stop, chest pain or shortness of breath.

## 2017-07-22 NOTE — ED Triage Notes (Addendum)
Patient arrives by EMS with complaints of nausea and vomiting asociated with abdominal pain and syncopal episode-superficial lac back of head with associated headache. Patient hit back of head on vanity. EMS states he vomited 1 liter-patient self administered Zofran prior to EMS arrival. Patient has hx diverticulitis. CBG 147 BP 114/74 HR 92 RR16 O2 99% RA. Patient states he awoke feeling nauseated and got up to go to the bathroom when he had the syncopal episode.

## 2019-06-29 HISTORY — PX: COLONOSCOPY: SHX174

## 2019-08-16 ENCOUNTER — Ambulatory Visit: Payer: Self-pay | Admitting: Surgery

## 2019-08-16 DIAGNOSIS — K4021 Bilateral inguinal hernia, without obstruction or gangrene, recurrent: Secondary | ICD-10-CM | POA: Insufficient documentation

## 2019-08-16 DIAGNOSIS — K4091 Unilateral inguinal hernia, without obstruction or gangrene, recurrent: Secondary | ICD-10-CM

## 2019-08-16 NOTE — H&P (Signed)
MITUL KUEFLER Documented: 07/30/2019 9:45 AM Location: [facility] Patient #: [id number] DOB: 1975-12-18 Married / Language: Cleophus Molt / Race: White Male   History of Present Illness Adin Hector MD; 07/30/2019 11:19 AM) The patient is a 44 year old male who presents with a pilonidal cyst. Note for "Pilonidal cyst": ` ` ` Patient sent for surgical consultation at the request of Dr. Jani Gravel with [facility].  Chief Complaint: Right groin swelling. Probable right inguinal hernia. History of pilonidal disease with questionable flare versus anal fissure. ` ` The patient is an active male that has seen our group in the past. Had some groin discomfort. Seen by Dr. Georgette Dover in 2013. No evidence of definite hernia. Then saw me 4 years ago for concern of right groin discomfort and possible recurrent pilonidal disease. The time I cannot feel a definite hernia but recommended follow-up if it worsened. Did not see any major pilonidal disease so held off on a more aggressive intervention. He seemed reassured.  More recently he noted he get some irritation in his intergluteal cleft. Wondered if his pilonidal disease has come back. He see some occasional topical triple antibiotic cream and occasionally some oral Bactrim her doxycycline which helps. Several months ago. He did note that the right groin has an obvious bulge now. He did have a CAT scan last year during if flare of diverticulitis. Right inguinal hernia obvious on that CT scan 2019. Patient recently had a colonoscopy by Dr. Benson Norway 2 weeks ago. He felt like he got another flare diverticulitis and was placed on a short course of oral antibiotics. He did have some rash and irritation but that seems to calm down. He thinks he's had probably about 6 attacks of diverticulitis. He claims to be admitted for them without an abscess. I see no record in the past 10 years in our system. He normally moves his bowels  once a day. He has been on nicotine patch and does not smoke anymore. No problems with urination or defecation. He does note that he will get some right perineal pain with intense physical activity including sexual intercourse. Thinks it may get some groin and testicular pain at the same time as well but is not certain. He was concerned that maybe a recurrent pilonidal disease or disease had some mild irritation over his tailbone.  (Review of systems as stated in this history (HPI) or in the review of systems. Otherwise all other 12 point ROS are negative) ` ` This patient encounter took 60 minutes today to perform the following: obtain history, perform exam, review outside records, interpret tests & imaging, counsel the patient on their diagnosis; and, document this encounter, including findings & plan in the electronic health record (EHR).   Past Surgical History Antonietta Jewel, [place]; 07/30/2019 9:45 AM) Open Inguinal Hernia Surgery  Bilateral. multiple Oral Surgery   Diagnostic Studies History (Chanel Teressa Senter, CMA; 07/30/2019 9:45 AM) Colonoscopy  within last year never  Allergies Antonietta Jewel, CMA; 07/30/2019 9:46 AM) Flagyl *ANTI-INFECTIVE AGENTS - MISC.*  Metronidazole (Topical) *DERMATOLOGICALS*  Penicillin V Potassium *PENICILLINS*  BEE VENOM  Allergies Reconciled   Medication History (Chanel Teressa Senter, CMA; 07/30/2019 9:46 AM) Ativan (0.5MG  Tablet, Oral) Active. Medications Reconciled  Social History Antonietta Jewel, CMA; 07/30/2019 9:45 AM) Alcohol use  Occasional alcohol use. Caffeine use  Coffee. No drug use  Tobacco use  Current every day smoker, Former smoker.  Family History ([place], [place]; 07/30/2019 9:45 AM) Alcohol Abuse  Father. Arthritis  Mother. Diabetes Mellitus  Mother. Heart disease in male family member before age 48   Other Problems (Chanel Teressa Senter, [place]; 07/30/2019 9:45 AM) Anxiety Disorder  Back Pain  Diverticulosis  Hypercholesterolemia   Inguinal Hernia  Kidney Stone  Other disease, cancer, significant illness     Review of Systems (Chanel Nolan CMA; 07/30/2019 9:45 AM) General Not Present- Appetite Loss, Chills, Fatigue, Fever, Night Sweats, Weight Gain and Weight Loss. Skin Present- Rash. Not Present- Change in Wart/Mole, Dryness, Hives, Jaundice, New Lesions, Non-Healing Wounds and Ulcer. HEENT Present- Seasonal Allergies and Wears glasses/contact lenses. Not Present- Earache, Hearing Loss, Hoarseness, Nose Bleed, Oral Ulcers, Ringing in the Ears, Sinus Pain, Sore Throat, Visual Disturbances and Yellow Eyes. Respiratory Not Present- Bloody sputum, Chronic Cough, Difficulty Breathing, Snoring and Wheezing. Breast Not Present- Breast Mass, Breast Pain, Nipple Discharge and Skin Changes. Cardiovascular Not Present- Chest Pain, Difficulty Breathing Lying Down, Leg Cramps, Palpitations, Rapid Heart Rate, Shortness of Breath and Swelling of Extremities. Gastrointestinal Not Present- Abdominal Pain, Bloating, Bloody Stool, Change in Bowel Habits, Chronic diarrhea, Constipation, Difficulty Swallowing, Excessive gas, Gets full quickly at meals, Hemorrhoids, Indigestion, Nausea, Rectal Pain and Vomiting. Male Genitourinary Not Present- Blood in Urine, Change in Urinary Stream, Frequency, Impotence, Nocturia, Painful Urination, Urgency and Urine Leakage. Musculoskeletal Present- Back Pain. Not Present- Joint Pain, Joint Stiffness, Muscle Pain, Muscle Weakness and Swelling of Extremities. Neurological Not Present- Decreased Memory, Fainting, Headaches, Numbness, Seizures, Tingling, Tremor, Trouble walking and Weakness. Psychiatric Present- Anxiety. Not Present- Bipolar, Change in Sleep Pattern, Depression, Fearful and Frequent crying. Endocrine Not Present- Cold Intolerance, Excessive Hunger, Hair Changes, Heat Intolerance, Hot flashes and New Diabetes. Hematology Not Present- Blood Thinners, Easy Bruising, Excessive bleeding, Gland  problems, HIV and Persistent Infections.  Vitals (Chanel Nolan CMA; 07/30/2019 9:47 AM) 07/30/2019 9:46 AM Weight: 224.25 lb Height: 69in Body Surface Area: 2.17 m Body Mass Index: 33.12 kg/m  Temp.: 99.85F  Pulse: 101 (Regular)  BP: 110/62 (Sitting, Left Arm, Standard)       Physical Exam Adin Hector MD; 07/30/2019 11:12 AM) General Mental Status-Alert. General Appearance-Not in acute distress, Not Sickly. Orientation-Oriented X3. Hydration-Well hydrated. Voice-Normal.  Integumentary Global Assessment Upon inspection and palpation of skin surfaces of the - Axillae: non-tender, no inflammation or ulceration, no drainage. and Distribution of scalp and body hair is normal. General Characteristics Temperature - normal warmth is noted.  Head and Neck Head-normocephalic, atraumatic with no lesions or palpable masses. Face Global Assessment - atraumatic, no absence of expression. Neck Global Assessment - no abnormal movements, no bruit auscultated on the right, no bruit auscultated on the left, no decreased range of motion, non-tender. Trachea-midline. Thyroid Gland Characteristics - non-tender.  Eye Eyeball - Left-Extraocular movements intact, No Nystagmus - Left. Eyeball - Right-Extraocular movements intact, No Nystagmus - Right. Cornea - Left-No Hazy - Left. Cornea - Right-No Hazy - Right. Sclera/Conjunctiva - Left-No scleral icterus, No Discharge - Left. Sclera/Conjunctiva - Right-No scleral icterus, No Discharge - Right. Pupil - Left-Direct reaction to light normal. Pupil - Right-Direct reaction to light normal.  ENMT Ears Pinna - Left - no drainage observed, no generalized tenderness observed. Pinna - Right - no drainage observed, no generalized tenderness observed. Nose and Sinuses External Inspection of the Nose - no destructive lesion observed. Inspection of the nares - Left - quiet respiration. Inspection of the nares  - Right - quiet respiration. Mouth and Throat Lips - Upper Lip - no fissures observed, no pallor noted. Lower Lip - no fissures  observed, no pallor noted. Nasopharynx - no discharge present. Oral Cavity/Oropharynx - Tongue - no dryness observed. Oral Mucosa - no cyanosis observed. Hypopharynx - no evidence of airway distress observed.  Chest and Lung Exam Inspection Movements - Normal and Symmetrical. Accessory muscles - No use of accessory muscles in breathing. Palpation Palpation of the chest reveals - Non-tender. Auscultation Breath sounds - Normal and Clear.  Cardiovascular Auscultation Rhythm - Regular. Murmurs & Other Heart Sounds - Auscultation of the heart reveals - No Murmurs and No Systolic Clicks.  Abdomen Inspection Inspection of the abdomen reveals - No Visible peristalsis and No Abnormal pulsations. Umbilicus - No Bleeding, No Urine drainage. Palpation/Percussion Palpation and Percussion of the abdomen reveal - Soft, Non Tender, No Rebound tenderness, No Rigidity (guarding) and No Cutaneous hyperesthesia. Note: Abdomen soft. Not obese. No diastases recti. Nontender, nondistended. No guarding. No umbilical hernias   Male Genitourinary Sexual Maturity Tanner 5 - Adult hair pattern and Adult penile size and shape. Note: Obvious right groin hernia sensitive a reducible. Impulse on left groin was sensitivity suspicious for small left inguinal hernia as well. This correlates with CT scan 2019. Bilateral groin incisions consistent with his inguinal hernia repairs done when he was an infant. Normal external genitalia. Epididymi, testes, and spermatic cords normal without any masses.   Rectal Note: Scarring in the intergluteal cleft upper midline. No pits. However narrow 12 x 1 mm crack of maceration in the lower intragluteal cleft. Superficial left medial gluteal irritation. No purulence or fluctuance. No midline pits. No fluctuance. No erythema. No warmth. No  drainage. No pain. No sinus. Moderate hair burden in a deep intergluteal cleft - I plucked away. Consistent with excision of prior pilonidal disease.  Perianal skin clear. No fissure. Internal sphincter tone. No abscess. No external hemorrhoids. Tolerated digital and anoscopic exam. Grade 1-2 internal hemorrhoids without irritation. No prolapse. Proctitis. Anterior midline anal canal scarring consistent with possible prior healed fissure. No definite condyloma or abscess.   Peripheral Vascular Upper Extremity Inspection - Left - No Cyanotic nailbeds - Left, Not Ischemic. Inspection - Right - No Cyanotic nailbeds - Right, Not Ischemic.  Neurologic Neurologic evaluation reveals -normal attention span and ability to concentrate, able to name objects and repeat phrases. Appropriate fund of knowledge , normal sensation and normal coordination. Mental Status Affect - not angry, not paranoid. Cranial Nerves-Normal Bilaterally. Gait-Normal.  Neuropsychiatric Mental status exam performed with findings of-able to articulate well with normal speech/language, rate, volume and coherence, thought content normal with ability to perform basic computations and apply abstract reasoning and no evidence of hallucinations, delusions, obsessions or homicidal/suicidal ideation.  Musculoskeletal Global Assessment Spine, Ribs and Pelvis - no instability, subluxation or laxity. Right Upper Extremity - no instability, subluxation or laxity.  Lymphatic Head & Neck General Head & Neck Lymphatics: Bilateral - Description - No Localized lymphadenopathy. Axillary General Axillary Region: Bilateral - Description - No Localized lymphadenopathy. Femoral & Inguinal Generalized Femoral & Inguinal Lymphatics: Left - Description - No Localized lymphadenopathy. Right - Description - No Localized lymphadenopathy.   Results Adin Hector MD; 07/30/2019 11:20  AM) Procedures  Name Value Date Hemorrhoids Procedure Other: Scarring in the intergluteal cleft upper midline. No pits. However narrow 12 x 1 mm crack of maceration in the lower intragluteal cleft. Superficial left medial gluteal irritation. No purulence or fluctuance. No midline pits. No fluctuance. No erythema. No warmth. No drainage. No pain. No sinus. Moderate hair burden in a deep intergluteal cleft - I plucked  away. Consistent with excision of prior pilonidal disease............Marland KitchenPerianal skin clear. No fissure. Internal sphincter tone. No abscess. No external hemorrhoids. Tolerated digital and anoscopic exam. Grade 1-2 internal hemorrhoids without irritation. No prolapse. Proctitis. Anterior midline anal canal scarring consistent with possible prior healed fissure. No definite condyloma or abscess.  Performed: 07/30/2019 11:12 AM    Assessment & Plan Adin Hector MD; 07/30/2019 11:20 AM) RECURRENT RIGHT INGUINAL HERNIA (K40.91) Impression: Definite right inguinal hernia by exam and CT scan. Possible subtle one on the left side. Recurrent since he had prior pediatric inguinal hernia repairs when he was an infant  At some point, I think he would benefit from surgery to repair this. We do laparoscopic preperitoneal approach with repair of right side and examination possible repair of left side if inguinal hernia found there as well.  While he does have some irritation in the intergluteal cleft, I don't think he has any severe overactive pilonidal disease. I noted if he has purulence worsening discomfort, that needs to be addressed first minimize risk of infection. He feels likely inguinal hernias the more pressing issue but he wishes to discuss with his has been and will let us know.  He's leaning toward surgery which is discussed with his husband first. I gave him my card. He will call let us now. Current Plans Call back in 1 week with progress PREOP - ING  HERNIA - ENCOUNTER FOR PREOPERATIVE EXAMINATION FOR GENERAL SURGICAL PROCEDURE (Z01.818) Current Plans You are being scheduled for surgery- Our schedulers will call you.  You should hear from our office's scheduling department within 5 working days about the location, date, and time of surgery. We try to make accommodations for patient's preferences in scheduling surgery, but sometimes the OR schedule or the surgeon's schedule prevents [place] from making those accommodations.  If you have not heard from our office [phone]) in 5 working days, call the office and ask for your surgeon's nurse.  If you have other questions about your diagnosis, plan, or surgery, call the office and ask for your surgeon's nurse.  Written instructions provided The anatomy & physiology of the abdominal wall and pelvic floor was discussed. The pathophysiology of hernias in the inguinal and pelvic region was discussed. Natural history risks such as progressive enlargement, pain, incarceration, and strangulation was discussed. Contributors to complications such as smoking, obesity, diabetes, prior surgery, etc were discussed.  I feel the risks of no intervention will lead to serious problems that outweigh the operative risks; therefore, I recommended surgery to reduce and repair the hernia. I explained laparoscopic techniques with possible need for an open approach. I noted usual use of mesh to patch and/or buttress hernia repair  Risks such as bleeding, infection, abscess, need for further treatment, heart attack, death, and other risks were discussed. I noted a good likelihood this will help address the problem. Goals of post-operative recovery were discussed as well. Possibility that this will not correct all symptoms was explained. I stressed the importance of low-impact activity, aggressive pain control, avoiding constipation, & not pushing through pain to minimize risk of post-operative chronic pain or  injury. Possibility of reherniation was discussed. We will work to minimize complications.  An educational handout further explaining the pathology & treatment options was given as well. Questions were answered. The patient expresses understanding & wishes to proceed with surgery.  Pt Education - Pamphlet Given - Laparoscopic Hernia Repair: discussed with patient and provided information. Pt Education - [facility] Pain Control (Torrell Krutz) Pt Education -  [facility] Hernia Post-Op HCI (Kapil Petropoulos): discussed with patient and provided information. Pt Education - [facility] Mesh education: discussed with patient and provided information. TOBACCO ABUSE (Z72.0) Impression: He has quit and is on a patch. That is a good sign. Hopefully that will allow her the chance of infection or recurrent pilonidal issues Current Plans Pt Education - [facility] STOP SMOKING! PILONIDAL CYST WITHOUT INFECTION (L05.91) Impression: Evidence of prior surgery when he was a teenager for pilonidal disease. Some chronic irritation. I don't know if this is related recurrent pilonidal disease or not. Seems more like some skin breakdown and irritation more than recurrent pilonidal disease. Hasn't get a definite recent history but somewhat suspicious. Should he develop painful swelling abscess with purulent drainage or have progression despite antibiotics, he may require urgent office evaluation with possible incision and drainage and eventual operative plan reexcision. He would like to hold off on that. I wish to be safe and minimize risk of infection before doing the inguinal hernia repairs since mesh placement in standard to lower recurrence Current Plans Return to clinic as needed.  Soreness, decreased appetite, and poor energy level are common problems after surgery. While many people can struggle with a bad day, these concerns should gradually fade away or at least improve. Much of your recovery depends on your health & the severity of your operation.  Please call if you have any further questions / concerns related to surgery.  Increase activity as tolerated to regular everyday activity. Consider daily low impact exercise every day such as walking an hour a day.  Do not push through pain. If it hurts to do it, then don't do it.  Diet as tolerated. Low fat high fiber diet ideal. 30 g fiber a day ideal. Consider taking a daily fiber supplement to keep your bowels regular.  Followup with your primary care physician for other health issues as would normally be done.  Consider screening for malignancies (breast, prostate, colon, melanoma, etc) as appropriate. Discuss with you primary care physician.  The anatomy of the gluteal and sacral region was discussed. Pathophysiology of pilonidal disease due to ingrown hairs was discussed. Importance of good hygiene discussed. Importance of keeping hairs trimmed in the intergluteal crease from anus to top of sacrum/tailbone spine noted. Need for good hygiene stressed. Use of electric razor or nose hair trimmers to keep the hairs trimmed discussed.  Risk of worsening progression needing surgical drainage of abscess or perhaps excision of chronic pilonidal disease with skin flap closure over drains discussed. Possible need for her to leave it open with packing to allow the wound close over several weeks/months discussed. Risks benefits alternatives to surgery discussed as well. Risk of recurrent disease discussed. Patient was expressed understanding. Literature given to patient. We will see if we can control this without an operation first.  Pt Education - [facility] Pilonidal Disease ([place]) ANOSCOPY, DIAGNOSTIC [id number]) DIVERTICULITIS OF LARGE INTESTINE WITHOUT PERFORATION OR ABSCESS WITHOUT BLEEDING (K57.32) Impression: History of pain and diverticulitis with CT scan documenting diverticulitis in the past. He claims his had 6 attacks. Probable mild flare with his colonoscopy 2 weeks ago. Resolved  now. That is the most recent flare and several years. Followed by gastroenterology.  Should he develop evidence of stricture or recurrent/chronic attacks, he may benefit from sigmoid colectomy. Table this issue since it is not pressing. He seems reassured. Current Plans Pt Education - [facility] Diverticular Disease ([place])   Addendum: Patient is interested in proceeding with hernia surgery.  Will place orders.

## 2019-10-04 ENCOUNTER — Other Ambulatory Visit: Payer: Self-pay

## 2019-10-04 ENCOUNTER — Encounter (HOSPITAL_BASED_OUTPATIENT_CLINIC_OR_DEPARTMENT_OTHER): Payer: Self-pay | Admitting: Surgery

## 2019-10-04 NOTE — Progress Notes (Addendum)
Spoke w/ via phone for pre-op interview---patient Lab needs dos----     I stat 8        COVID test ------10-08-2019 1400 Arrive at -------730 am 10-11-2019 NPO after ------midnight Medications to take morning of surgery -----certrizine, lorazepam, zegrid Diabetic medication -----n/a Patient Special Instructions -----patient to call dr gross about if need to stop 81 mg aspirin, Addendum : patient called back and stated he was told by dr gross office to continue 81 mg aspirin. Pre-Op special Istructions -----none Patient verbalized understanding of instructions that were given at this phone interview. Patient denies shortness of breath, chest pain, fever, cough a this phone interview.

## 2019-10-08 ENCOUNTER — Encounter (HOSPITAL_BASED_OUTPATIENT_CLINIC_OR_DEPARTMENT_OTHER): Payer: Self-pay | Admitting: Surgery

## 2019-10-08 ENCOUNTER — Other Ambulatory Visit (HOSPITAL_COMMUNITY)
Admission: RE | Admit: 2019-10-08 | Discharge: 2019-10-08 | Disposition: A | Discharge status: Home / Self Care | Payer: Managed Care, Other (non HMO) | Source: Ambulatory Visit | Attending: Surgery | Admitting: Surgery

## 2019-10-08 DIAGNOSIS — Z01812 Encounter for preprocedural laboratory examination: Secondary | ICD-10-CM | POA: Diagnosis not present

## 2019-10-08 DIAGNOSIS — Z20822 Contact with and (suspected) exposure to covid-19: Secondary | ICD-10-CM | POA: Diagnosis not present

## 2019-10-08 LAB — SARS CORONAVIRUS 2 (TAT 6-24 HRS): SARS Coronavirus 2: NEGATIVE

## 2019-10-11 ENCOUNTER — Other Ambulatory Visit: Payer: Self-pay

## 2019-10-11 ENCOUNTER — Ambulatory Visit (HOSPITAL_BASED_OUTPATIENT_CLINIC_OR_DEPARTMENT_OTHER): Payer: Managed Care, Other (non HMO) | Admitting: Anesthesiology

## 2019-10-11 ENCOUNTER — Encounter (HOSPITAL_BASED_OUTPATIENT_CLINIC_OR_DEPARTMENT_OTHER): Payer: Self-pay | Admitting: Surgery

## 2019-10-11 ENCOUNTER — Encounter (HOSPITAL_BASED_OUTPATIENT_CLINIC_OR_DEPARTMENT_OTHER)
Admission: RE | Disposition: A | Discharge status: Home / Self Care | Payer: Self-pay | Source: Home / Self Care | Attending: Surgery

## 2019-10-11 ENCOUNTER — Ambulatory Visit (HOSPITAL_BASED_OUTPATIENT_CLINIC_OR_DEPARTMENT_OTHER)
Admission: RE | Admit: 2019-10-11 | Discharge: 2019-10-11 | Disposition: A | Discharge status: Home / Self Care | Payer: Managed Care, Other (non HMO) | Attending: Surgery | Admitting: Surgery

## 2019-10-11 DIAGNOSIS — F419 Anxiety disorder, unspecified: Secondary | ICD-10-CM | POA: Insufficient documentation

## 2019-10-11 DIAGNOSIS — E785 Hyperlipidemia, unspecified: Secondary | ICD-10-CM | POA: Diagnosis not present

## 2019-10-11 DIAGNOSIS — Z881 Allergy status to other antibiotic agents status: Secondary | ICD-10-CM | POA: Insufficient documentation

## 2019-10-11 DIAGNOSIS — K5732 Diverticulitis of large intestine without perforation or abscess without bleeding: Secondary | ICD-10-CM | POA: Insufficient documentation

## 2019-10-11 DIAGNOSIS — K429 Umbilical hernia without obstruction or gangrene: Secondary | ICD-10-CM | POA: Insufficient documentation

## 2019-10-11 DIAGNOSIS — Z811 Family history of alcohol abuse and dependence: Secondary | ICD-10-CM | POA: Diagnosis not present

## 2019-10-11 DIAGNOSIS — Z791 Long term (current) use of non-steroidal anti-inflammatories (NSAID): Secondary | ICD-10-CM | POA: Insufficient documentation

## 2019-10-11 DIAGNOSIS — Z6833 Body mass index (BMI) 33.0-33.9, adult: Secondary | ICD-10-CM | POA: Diagnosis not present

## 2019-10-11 DIAGNOSIS — Z8249 Family history of ischemic heart disease and other diseases of the circulatory system: Secondary | ICD-10-CM | POA: Diagnosis not present

## 2019-10-11 DIAGNOSIS — Z91030 Bee allergy status: Secondary | ICD-10-CM | POA: Diagnosis not present

## 2019-10-11 DIAGNOSIS — G8929 Other chronic pain: Secondary | ICD-10-CM | POA: Insufficient documentation

## 2019-10-11 DIAGNOSIS — Z833 Family history of diabetes mellitus: Secondary | ICD-10-CM | POA: Insufficient documentation

## 2019-10-11 DIAGNOSIS — K4021 Bilateral inguinal hernia, without obstruction or gangrene, recurrent: Secondary | ICD-10-CM | POA: Insufficient documentation

## 2019-10-11 DIAGNOSIS — M1990 Unspecified osteoarthritis, unspecified site: Secondary | ICD-10-CM | POA: Diagnosis not present

## 2019-10-11 DIAGNOSIS — Z79899 Other long term (current) drug therapy: Secondary | ICD-10-CM | POA: Diagnosis not present

## 2019-10-11 DIAGNOSIS — Z8261 Family history of arthritis: Secondary | ICD-10-CM | POA: Insufficient documentation

## 2019-10-11 DIAGNOSIS — Z888 Allergy status to other drugs, medicaments and biological substances status: Secondary | ICD-10-CM | POA: Insufficient documentation

## 2019-10-11 DIAGNOSIS — K219 Gastro-esophageal reflux disease without esophagitis: Secondary | ICD-10-CM | POA: Diagnosis not present

## 2019-10-11 DIAGNOSIS — Z880 Allergy status to penicillin: Secondary | ICD-10-CM | POA: Diagnosis not present

## 2019-10-11 DIAGNOSIS — Z87442 Personal history of urinary calculi: Secondary | ICD-10-CM | POA: Insufficient documentation

## 2019-10-11 DIAGNOSIS — E669 Obesity, unspecified: Secondary | ICD-10-CM | POA: Diagnosis not present

## 2019-10-11 DIAGNOSIS — Z87891 Personal history of nicotine dependence: Secondary | ICD-10-CM | POA: Insufficient documentation

## 2019-10-11 DIAGNOSIS — E7800 Pure hypercholesterolemia, unspecified: Secondary | ICD-10-CM | POA: Diagnosis not present

## 2019-10-11 DIAGNOSIS — Z7982 Long term (current) use of aspirin: Secondary | ICD-10-CM | POA: Diagnosis not present

## 2019-10-11 DIAGNOSIS — D176 Benign lipomatous neoplasm of spermatic cord: Secondary | ICD-10-CM | POA: Diagnosis not present

## 2019-10-11 DIAGNOSIS — M549 Dorsalgia, unspecified: Secondary | ICD-10-CM | POA: Insufficient documentation

## 2019-10-11 HISTORY — PX: INGUINAL HERNIA REPAIR: SHX194

## 2019-10-11 LAB — POCT I-STAT, CHEM 8
BUN: 21 mg/dL — ABNORMAL HIGH (ref 6–20)
Calcium, Ion: 1.25 mmol/L (ref 1.15–1.40)
Chloride: 105 mmol/L (ref 98–111)
Creatinine, Ser: 0.8 mg/dL (ref 0.61–1.24)
Glucose, Bld: 100 mg/dL — ABNORMAL HIGH (ref 70–99)
HCT: 46 % (ref 39.0–52.0)
Hemoglobin: 15.6 g/dL (ref 13.0–17.0)
Potassium: 4.2 mmol/L (ref 3.5–5.1)
Sodium: 141 mmol/L (ref 135–145)
TCO2: 26 mmol/L (ref 22–32)

## 2019-10-11 SURGERY — REPAIR, HERNIA, INGUINAL, LAPAROSCOPIC
Anesthesia: General | Site: Abdomen

## 2019-10-11 MED ORDER — PROPOFOL 10 MG/ML IV BOLUS
INTRAVENOUS | Status: DC | PRN
  Administered 2019-10-11: 180 mg via INTRAVENOUS

## 2019-10-11 MED ORDER — CHLORHEXIDINE GLUCONATE CLOTH 2 % EX PADS
6.0000 | MEDICATED_PAD | Freq: Once | CUTANEOUS | Status: DC
  Filled 2019-10-11: qty 6

## 2019-10-11 MED ORDER — ONDANSETRON HCL 4 MG/2ML IJ SOLN
INTRAMUSCULAR | Status: DC | PRN
  Administered 2019-10-11: 4 mg via INTRAVENOUS

## 2019-10-11 MED ORDER — GABAPENTIN 300 MG PO CAPS
300.0000 mg | ORAL_CAPSULE | ORAL | Status: AC
  Administered 2019-10-11: 300 mg via ORAL
  Filled 2019-10-11: qty 1

## 2019-10-11 MED ORDER — BUPIVACAINE-EPINEPHRINE 0.25% -1:200000 IJ SOLN
INTRAMUSCULAR | Status: DC | PRN
  Administered 2019-10-11: 60 mL

## 2019-10-11 MED ORDER — GENTAMICIN SULFATE 40 MG/ML IJ SOLN
5.0000 mg/kg | INTRAVENOUS | Status: AC
  Administered 2019-10-11: 420 mg via INTRAVENOUS
  Filled 2019-10-11: qty 10.5

## 2019-10-11 MED ORDER — CLINDAMYCIN PHOSPHATE 900 MG/50ML IV SOLN
900.0000 mg | INTRAVENOUS | Status: AC
  Administered 2019-10-11: 900 mg via INTRAVENOUS
  Filled 2019-10-11: qty 50

## 2019-10-11 MED ORDER — TRAMADOL HCL 50 MG PO TABS
ORAL_TABLET | ORAL | Status: AC
  Filled 2019-10-11: qty 1

## 2019-10-11 MED ORDER — LIDOCAINE 2% (20 MG/ML) 5 ML SYRINGE
INTRAMUSCULAR | Status: AC
  Filled 2019-10-11: qty 5

## 2019-10-11 MED ORDER — PHENYLEPHRINE 40 MCG/ML (10ML) SYRINGE FOR IV PUSH (FOR BLOOD PRESSURE SUPPORT)
PREFILLED_SYRINGE | INTRAVENOUS | Status: DC | PRN
  Administered 2019-10-11: 80 ug via INTRAVENOUS
  Administered 2019-10-11: 40 ug via INTRAVENOUS
  Administered 2019-10-11: 80 ug via INTRAVENOUS

## 2019-10-11 MED ORDER — HYDROMORPHONE HCL 1 MG/ML IJ SOLN
INTRAMUSCULAR | Status: AC
  Filled 2019-10-11: qty 1

## 2019-10-11 MED ORDER — MIDAZOLAM HCL 5 MG/5ML IJ SOLN
INTRAMUSCULAR | Status: DC | PRN
  Administered 2019-10-11: 2 mg via INTRAVENOUS

## 2019-10-11 MED ORDER — GABAPENTIN 300 MG PO CAPS
ORAL_CAPSULE | ORAL | Status: AC
  Filled 2019-10-11: qty 1

## 2019-10-11 MED ORDER — CELECOXIB 200 MG PO CAPS
ORAL_CAPSULE | ORAL | Status: AC
  Filled 2019-10-11: qty 2

## 2019-10-11 MED ORDER — OXYCODONE HCL 5 MG/5ML PO SOLN
5.0000 mg | Freq: Once | ORAL | Status: DC | PRN
  Filled 2019-10-11: qty 5

## 2019-10-11 MED ORDER — ONDANSETRON HCL 4 MG/2ML IJ SOLN
INTRAMUSCULAR | Status: AC
  Filled 2019-10-11: qty 2

## 2019-10-11 MED ORDER — LIDOCAINE 2% (20 MG/ML) 5 ML SYRINGE
INTRAMUSCULAR | Status: DC | PRN
  Administered 2019-10-11: 1.5 mg/kg/h via INTRAVENOUS

## 2019-10-11 MED ORDER — TRAMADOL HCL 50 MG PO TABS
50.0000 mg | ORAL_TABLET | Freq: Once | ORAL | Status: AC
  Administered 2019-10-11: 50 mg via ORAL
  Filled 2019-10-11: qty 1

## 2019-10-11 MED ORDER — LACTATED RINGERS IV SOLN
INTRAVENOUS | Status: DC
  Administered 2019-10-11: 1000 mL via INTRAVENOUS
  Filled 2019-10-11: qty 1000

## 2019-10-11 MED ORDER — ROCURONIUM BROMIDE 10 MG/ML (PF) SYRINGE
PREFILLED_SYRINGE | INTRAVENOUS | Status: AC
  Filled 2019-10-11: qty 10

## 2019-10-11 MED ORDER — SUGAMMADEX SODIUM 200 MG/2ML IV SOLN
INTRAVENOUS | Status: DC | PRN
  Administered 2019-10-11: 200 mg via INTRAVENOUS

## 2019-10-11 MED ORDER — ACETAMINOPHEN 500 MG PO TABS
1000.0000 mg | ORAL_TABLET | ORAL | Status: AC
  Administered 2019-10-11: 1000 mg via ORAL
  Filled 2019-10-11: qty 2

## 2019-10-11 MED ORDER — ACETAMINOPHEN 500 MG PO TABS
ORAL_TABLET | ORAL | Status: AC
  Filled 2019-10-11: qty 2

## 2019-10-11 MED ORDER — CELECOXIB 400 MG PO CAPS
400.0000 mg | ORAL_CAPSULE | ORAL | Status: AC
  Administered 2019-10-11: 400 mg via ORAL
  Filled 2019-10-11: qty 1

## 2019-10-11 MED ORDER — HYDROMORPHONE HCL 1 MG/ML IJ SOLN
0.2500 mg | INTRAMUSCULAR | Status: DC | PRN
  Administered 2019-10-11 (×2): 0.25 mg via INTRAVENOUS
  Filled 2019-10-11: qty 0.5

## 2019-10-11 MED ORDER — CLINDAMYCIN PHOSPHATE 900 MG/50ML IV SOLN
INTRAVENOUS | Status: AC
  Filled 2019-10-11: qty 50

## 2019-10-11 MED ORDER — PROPOFOL 10 MG/ML IV BOLUS
INTRAVENOUS | Status: AC
  Filled 2019-10-11: qty 20

## 2019-10-11 MED ORDER — FENTANYL CITRATE (PF) 100 MCG/2ML IJ SOLN
INTRAMUSCULAR | Status: AC
  Filled 2019-10-11: qty 2

## 2019-10-11 MED ORDER — MIDAZOLAM HCL 2 MG/2ML IJ SOLN
INTRAMUSCULAR | Status: AC
  Filled 2019-10-11: qty 2

## 2019-10-11 MED ORDER — PROMETHAZINE HCL 25 MG/ML IJ SOLN
6.2500 mg | INTRAMUSCULAR | Status: DC | PRN
  Filled 2019-10-11: qty 1

## 2019-10-11 MED ORDER — OXYCODONE HCL 5 MG PO TABS
5.0000 mg | ORAL_TABLET | Freq: Once | ORAL | Status: DC | PRN
  Filled 2019-10-11: qty 1

## 2019-10-11 MED ORDER — TRAMADOL HCL 50 MG PO TABS: 50.0000 mg | ORAL_TABLET | Freq: Four times a day (QID) | ORAL | 0 refills | Status: DC | PRN

## 2019-10-11 MED ORDER — 0.9 % SODIUM CHLORIDE (POUR BTL) OPTIME
TOPICAL | Status: DC | PRN
  Administered 2019-10-11: 2000 mL

## 2019-10-11 MED ORDER — BUPIVACAINE LIPOSOME 1.3 % IJ SUSP
20.0000 mL | Freq: Once | INTRAMUSCULAR | Status: DC
  Filled 2019-10-11: qty 20

## 2019-10-11 MED ORDER — FENTANYL CITRATE (PF) 100 MCG/2ML IJ SOLN
INTRAMUSCULAR | Status: DC | PRN
  Administered 2019-10-11 (×2): 50 ug via INTRAVENOUS

## 2019-10-11 MED ORDER — KETAMINE HCL 10 MG/ML IJ SOLN
INTRAMUSCULAR | Status: DC | PRN
  Administered 2019-10-11 (×2): 15 mg via INTRAVENOUS

## 2019-10-11 MED ORDER — ROCURONIUM BROMIDE 10 MG/ML (PF) SYRINGE
PREFILLED_SYRINGE | INTRAVENOUS | Status: DC | PRN
  Administered 2019-10-11: 70 mg via INTRAVENOUS
  Administered 2019-10-11 (×2): 10 mg via INTRAVENOUS

## 2019-10-11 MED ORDER — DEXAMETHASONE SODIUM PHOSPHATE 10 MG/ML IJ SOLN
INTRAMUSCULAR | Status: DC | PRN
  Administered 2019-10-11: 10 mg via INTRAVENOUS

## 2019-10-11 MED ORDER — DEXAMETHASONE SODIUM PHOSPHATE 10 MG/ML IJ SOLN
INTRAMUSCULAR | Status: AC
  Filled 2019-10-11: qty 1

## 2019-10-11 MED ORDER — BUPIVACAINE LIPOSOME 1.3 % IJ SUSP
INTRAMUSCULAR | Status: DC | PRN
  Administered 2019-10-11: 20 mL

## 2019-10-11 SURGICAL SUPPLY — 39 items
CABLE HIGH FREQUENCY MONO STRZ (ELECTRODE) ×3 IMPLANT
CANISTER SUCT 3000ML PPV (MISCELLANEOUS) IMPLANT
CHLORAPREP W/TINT 26 (MISCELLANEOUS) ×3 IMPLANT
COVER WAND RF STERILE (DRAPES) ×3 IMPLANT
DECANTER SPIKE VIAL GLASS SM (MISCELLANEOUS) ×3 IMPLANT
DEVICE SECURE STRAP 25 ABSORB (INSTRUMENTS) IMPLANT
DRAPE WARM FLUID 44X44 (DRAPES) ×3 IMPLANT
DRSG TEGADERM 2-3/8X2-3/4 SM (GAUZE/BANDAGES/DRESSINGS) ×6 IMPLANT
DRSG TEGADERM 4X4.75 (GAUZE/BANDAGES/DRESSINGS) ×3 IMPLANT
ELECT REM PT RETURN 9FT ADLT (ELECTROSURGICAL) ×3
ELECTRODE REM PT RTRN 9FT ADLT (ELECTROSURGICAL) ×1 IMPLANT
GLOVE ECLIPSE 8.0 STRL XLNG CF (GLOVE) ×3 IMPLANT
GLOVE INDICATOR 8.0 STRL GRN (GLOVE) ×3 IMPLANT
GOWN STRL REUS W/TWL XL LVL3 (GOWN DISPOSABLE) ×3 IMPLANT
IRRIG SUCT STRYKERFLOW 2 WTIP (MISCELLANEOUS)
IRRIGATION SUCT STRKRFLW 2 WTP (MISCELLANEOUS) IMPLANT
KIT TURNOVER CYSTO (KITS) ×3 IMPLANT
MANIFOLD NEPTUNE II (INSTRUMENTS) IMPLANT
MESH ULTRAPRO 6X6 15CM15CM (Mesh General) ×6 IMPLANT
NEEDLE HYPO 22GX1.5 SAFETY (NEEDLE) ×3 IMPLANT
NS IRRIG 500ML POUR BTL (IV SOLUTION) ×3 IMPLANT
PACK BASIN DAY SURGERY FS (CUSTOM PROCEDURE TRAY) ×3 IMPLANT
PAD POSITIONING PINK XL (MISCELLANEOUS) ×3 IMPLANT
SCISSORS LAP 5X35 DISP (ENDOMECHANICALS) ×3 IMPLANT
SET TUBE SMOKE EVAC HIGH FLOW (TUBING) ×3 IMPLANT
SLEEVE ADV FIXATION 5X100MM (TROCAR) ×3 IMPLANT
SPONGE GAUZE 2X2 8PLY STER LF (GAUZE/BANDAGES/DRESSINGS) ×2
SPONGE GAUZE 2X2 8PLY STRL LF (GAUZE/BANDAGES/DRESSINGS) ×5 IMPLANT
SUT MNCRL AB 4-0 PS2 18 (SUTURE) ×3 IMPLANT
SUT PDS AB 1 CT1 27 (SUTURE) ×4 IMPLANT
SUT VIC AB 2-0 SH 27 (SUTURE) ×6
SUT VIC AB 2-0 SH 27XBRD (SUTURE) IMPLANT
SUT VICRYL 0 UR6 27IN ABS (SUTURE) IMPLANT
TOWEL OR 17X26 10 PK STRL BLUE (TOWEL DISPOSABLE) ×3 IMPLANT
TRAY DSU PREP LF (CUSTOM PROCEDURE TRAY) ×3 IMPLANT
TRAY LAPAROSCOPIC (CUSTOM PROCEDURE TRAY) ×3 IMPLANT
TROCAR ADV FIXATION 5X100MM (TROCAR) ×3 IMPLANT
TROCAR XCEL BLUNT TIP 100MML (ENDOMECHANICALS) ×3 IMPLANT
WATER STERILE IRR 500ML POUR (IV SOLUTION) ×3 IMPLANT

## 2019-10-11 NOTE — Discharge Instructions (Signed)
Post Anesthesia Home Care Instructions  Activity: Get plenty of rest for the remainder of the day. A responsible individual must stay with you for 24 hours following the procedure.  For the next 24 hours, DO NOT: -Drive a car -Paediatric nurse -Drink alcoholic beverages -Take any medication unless instructed by your physician -Make any legal decisions or sign important papers.  Meals: Start with liquid foods such as gelatin or soup. Progress to regular foods as tolerated. Avoid greasy, spicy, heavy foods. If nausea and/or vomiting occur, drink only clear liquids until the nausea and/or vomiting subsides. Call your physician if vomiting continues.  Special Instructions/Symptoms: Your throat may feel dry or sore from the anesthesia or the breathing tube placed in your throat during surgery. If this causes discomfort, gargle with warm salt water. The discomfort should disappear within 24 hours.  If you had a scopolamine patch placed behind your ear for the management of post- operative nausea and/or vomiting:  1. The medication in the patch is effective for 72 hours, after which it should be removed.  Wrap patch in a tissue and discard in the trash. Wash hands thoroughly with soap and water. 2. You may remove the patch earlier than 72 hours if you experience unpleasant side effects which may include dry mouth, dizziness or visual disturbances. 3. Avoid touching the patch. Wash your hands with soap and water after contact with the patch.     Inguinal Hernia, Adult An inguinal hernia develops when fat or the intestines push through a weak spot in a muscle where your leg meets your lower abdomen (groin). This creates a bulge. This kind of hernia could also be:  In your scrotum, if you are male.  In folds of skin around your vagina, if you are male. There are three types of inguinal hernias:  Hernias that can be pushed back into the abdomen (are reducible). This type rarely causes  pain.  Hernias that are not reducible (are incarcerated).  Hernias that are not reducible and lose their blood supply (are strangulated). This type of hernia requires emergency surgery. What are the causes? This condition is caused by having a weak spot in the muscles or tissues in the groin. This weak spot develops over time. The hernia may poke through the weak spot when you suddenly strain your lower abdominal muscles, such as when you:  Lift a heavy object.  Strain to have a bowel movement. Constipation can lead to straining.  Cough. What increases the risk? This condition is more likely to develop in:  Men.  Pregnant women.  People who: ? Are overweight. ? Work in jobs that require long periods of standing or heavy lifting. ? Have had an inguinal hernia before. ? Smoke or have lung disease. These factors can lead to long-lasting (chronic) coughing. What are the signs or symptoms? Symptoms may depend on the size of the hernia. Often, a small inguinal hernia has no symptoms. Symptoms of a larger hernia may include:  A lump in the groin area. This is easier to see when standing. It might not be visible when lying down.  Pain or burning in the groin. This may get worse when lifting, straining, or coughing.  A dull ache or a feeling of pressure in the groin.  In men, an unusual lump in the scrotum. Symptoms of a strangulated inguinal hernia may include:  A bulge in your groin that is very painful and tender to the touch.  A bulge that  turns red or purple.  Fever, nausea, and vomiting.  Inability to have a bowel movement or to pass gas. How is this diagnosed? This condition is diagnosed based on your symptoms, your medical history, and a physical exam. Your health care provider may feel your groin area and ask you to cough. How is this treated? Treatment depends on the size of your hernia and whether you have symptoms. If you do not have symptoms, your health care  provider may have you watch your hernia carefully and have you come in for follow-up visits. If your hernia is large or if you have symptoms, you may need surgery to repair the hernia. Follow these instructions at home: Lifestyle  Avoid lifting heavy objects.  Avoid standing for long periods of time.  Do not use any products that contain nicotine or tobacco, such as cigarettes and e-cigarettes. If you need help quitting, ask your health care provider.  Maintain a healthy weight. Preventing constipation  Take actions to prevent constipation. Constipation leads to straining with bowel movements, which can make a hernia worse or cause a hernia repair to break down. Your health care provider may recommend that you: ? Drink enough fluid to keep your urine pale yellow. ? Eat foods that are high in fiber, such as fresh fruits and vegetables, whole grains, and beans. ? Limit foods that are high in fat and processed sugars, such as fried or sweet foods. ? Take an over-the-counter or prescription medicine for constipation. General instructions  You may try to push the hernia back in place by very gently pressing on it while lying down. Do not try to force the bulge back in if it will not push in easily.  Watch your hernia for any changes in shape, size, or color. Get help right away if you notice any changes.  Take over-the-counter and prescription medicines only as told by your health care provider.  Keep all follow-up visits as told by your health care provider. This is important. Contact a health care provider if:  You have a fever.  You develop new symptoms.  Your symptoms get worse. Get help right away if:  You have pain in your groin that suddenly gets worse.  You have a bulge in your groin that: ? Suddenly gets bigger and does not get smaller. ? Becomes red or purple or painful to the touch.  You are a man and you have a sudden pain in your scrotum, or the size of your scrotum  suddenly changes.  You cannot push the hernia back in place by very gently pressing on it when you are lying down. Do not try to force the bulge back in if it will not push in easily.  You have nausea or vomiting that does not go away.  You have a fast heartbeat.  You cannot have a bowel movement or pass gas. These symptoms may represent a serious problem that is an emergency. Do not wait to see if the symptoms will go away. Get medical help right away. Call your local emergency services (911 in the [place] Summary  An inguinal hernia develops when fat or the intestines push through a weak spot in a muscle where your leg meets your lower abdomen (groin).  This condition is caused by having a weak spot in muscles or tissue in your groin.  Symptoms may depend on the size of the hernia, and they may include pain or swelling in your groin. A small inguinal hernia often has  no symptoms.  Treatment may not be needed if you do not have symptoms. If you have symptoms or a large hernia, you may need surgery to repair the hernia.  Avoid lifting heavy objects. Also avoid standing for long amounts of time. This information is not intended to replace advice given to you by your health care provider. Make sure you discuss any questions you have with your health care provider. Document Revised: 07/16/2017 Document Reviewed: 03/16/2017 Elsevier Patient Education  [place].

## 2019-10-11 NOTE — Op Note (Addendum)
10/11/2019  11:48 AM  PATIENT:  Brian Riddle  44 y.o. male  Patient Care Team: Jani Gravel, MD as PCP - General (Internal Medicine) Michael Boston, MD as Consulting Physician (General Surgery)  PRE-OPERATIVE DIAGNOSIS:   RIGHT AND POSSIBLE LEFT INGUINAL RECURRENT HERNIAS UMBILICAL HERNIA  POST-OPERATIVE DIAGNOSIS:   BILATERAL INGUINAL RECURRENT HERNIAS UMBILICAL HERNIA  PROCEDURE:   LAPAROSCOPIC BILATERAL INGUINAL HERNIA REPAIR UMBILICAL HERNIA REPAIR TAP BLOCK - BILATERAL  SURGEON:  Adin Hector, MD  ASSISTANT: Fran Lowes, PA-S, [employer]  ANESTHESIA:     Regional ilioinguinal and genitofemoral and spermatic cord nerve blocks  General  Nerve block provided with liposomal bupivacaine (Experel) mixed with 0.25% bupivacaine as a Bilateral TAP block x 3mL each side at the level of the transverse abdominis & preperitoneal spaces along the flank at the anterior axillary line, from subcostal ridge to iliac crest under laparoscopic guidance    EBL:  Total I/O In: 1100 [I.V.:1000; IV Piggyback:100] Out: - .  See anesthesia record  Delay start of Pharmacological VTE agent (>24hrs) due to surgical blood loss or risk of bleeding:  no  DRAINS: NONE  SPECIMEN:  NONE  DISPOSITION OF SPECIMEN:  N/A  COUNTS:  YES  PLAN OF CARE: Discharge to home after PACU  PATIENT DISPOSITION:  PACU - hemodynamically stable.  INDICATION: Active gentleman with history of bilateral inguinal hernia pairs as a infant.  Evidence of swelling with obvious right inguinal hernia.  Suspicion for left-sided on CT scan and physical exam as well.  Recommendation made for laparoscopic exploration and repair of hernias found.  Probable small umbilical hernia as well  The anatomy & physiology of the abdominal wall and pelvic floor was discussed.  The pathophysiology of hernias in the inguinal and pelvic region was discussed.  Natural history risks such as progressive enlargement, pain,  incarceration & strangulation was discussed.   Contributors to complications such as smoking, obesity, diabetes, prior surgery, etc were discussed.    I feel the risks of no intervention will lead to serious problems that outweigh the operative risks; therefore, I recommended surgery to reduce and repair the hernia.  I explained laparoscopic techniques with possible need for an open approach.  I noted usual use of mesh to patch and/or buttress hernia repair  Risks such as bleeding, infection, abscess, need for further treatment, heart attack, death, and other risks were discussed.  I noted a good likelihood this will help address the problem.   Goals of post-operative recovery were discussed as well.  Possibility that this will not correct all symptoms was explained.  I stressed the importance of low-impact activity, aggressive pain control, avoiding constipation, & not pushing through pain to minimize risk of post-operative chronic pain or injury. Possibility of reherniation was discussed.  We will work to minimize complications.     An educational handout further explaining the pathology & treatment options was given as well.  Questions were answered.  The patient expresses understanding & wishes to proceed with surgery.  OR FINDINGS: Right sided direct space hernia sac with some indirect component as well consistent with a pantaloon type hernia.  No femoral or obturator hernia.  On the left side small but definite indirect inguinal hernia.  No direct space femoral or obturator hernias.  7 mm umbilical hernia through the stalk.  Primarily repaired.  DESCRIPTION:  The patient was identified & brought into the operating room. The patient was positioned supine with arms tucked. SCDs were active during the entire  case. The patient underwent general anesthesia without any difficulty.  The abdomen was prepped and draped in a sterile fashion. The patient's bladder was emptied.  A Surgical Timeout  confirmed our plan.  I made a transverse incision through the inferior umbilical fold.  I made a small transverse nick through the anterior rectus fascia contralateral to the inguinal hernia side and placed a 0-vicryl stitch through the fascia.  I placed a Hasson trocar into the preperitoneal plane.  Entry was clean.  We induced carbon dioxide insufflation. Camera inspection revealed no injury.  I used a 21mm angled scope to bluntly free the peritoneum off the infraumbilical anterior abdominal wall.  I created enough of a preperitoneal pocket to place 82mm ports into the right & left mid-abdomen into this preperitoneal cavity.  I focused attention on the RIGHT pelvis since that was the dominant hernia side.   I used blunt & focused sharp dissection to free the peritoneum off the flank and down to the pubic rim.  I freed the anteriolateral bladder wall off the anteriolateral pelvic wall, sparing midline attachments.   I located a swath of peritoneum going into a hernia fascial defect at the  direct space consistent with  a direct space inguinal hernia..  I gradually freed the peritoneal hernia sac off safely and reduced it into the preperitoneal space.  I freed the peritoneum off the spermatic vessels & vas deferens.  I freed peritoneum off the retroperitoneum along the psoas muscle.  Spermatic cord lipoma was dissected away & removed.  I checked & assured hemostasis.  Patient had evidence of indirect inguinal hernia as well.  As anticipated, surgical dissection was challenged by dense adhesions and poor planes resulting in the need for careful repair of the resulting peritoneal hernia sac defects.  Repair was done with 2-0 Vicryl minimally invasive intracorporeal suturing using absorbable suture  I turned attention on the opposite  LEFT pelvis.  I did dissection in a similar, mirror-image fashion. The patient had an indirect inguinal hernia.Marland Kitchen   Spermatic cord lipoma was dissected away & removed.    I checked &  assured hemostasis.     I chose 15x15 cm sheets of ultra-lightweight polypropylene mesh (Ultrapro), one for each side.  I cut a single sigmoid-shaped slit ~6cm from a corner of each mesh.  I placed the meshes into the preperitoneal space & laid them as overlapping diamonds such that at the inferior points, a 6x6 cm corner flap rested in the true anterolateral pelvis, covering the obturator & femoral foramina.   I allowed the bladder to return to the pubis, this helping tuck the corners of the mesh in the anteriolateral pelvis.  The medial corners overlapped each other across midline cephalad to the pubic rim.   Given the numerous hernias of moderate size, I placed a third 15x15cm mesh in the center as a vertical diamond.  The lateral wings of the mesh overlap across the direct spaces and internal rings where the dominant hernias were.  This provided good coverage and reinforcement of the hernia repairs.  Because of the central mesh placement with good overlap, I did not place any tacks.   I held the hernia sacs cephalad & evacuated carbon dioxide.  I freed the umbilical stalk off the fascia to expose the 7 mm umbilical hernia.  This was primarily repaired with interrupted #1 PDS suture.  I closed the fascia with absorbable suture.  I closed the skin using 4-0 monocryl stitch.  Sterile dressings were  applied.   The patient was extubated & arrived in the PACU in stable condition..  I had discussed postoperative care with the patient in the holding area.  Instructions are written in the chart.  I discussed operative findings, updated the patient's status, discussed probable steps to recovery, and gave postoperative recommendations to the patient's significant other, Jae Dire. Recommendations were made.  Questions were answered.  He expressed understanding & appreciation.   Adin Hector, M.D., F.A.C.S. Gastrointestinal and Minimally Invasive Surgery [facility] [address]  [place], [place] [address] [phone] Main / Paging  10/11/2019 11:48 AM

## 2019-10-11 NOTE — Anesthesia Procedure Notes (Signed)
Procedure Name: Intubation Date/Time: 10/11/2019 10:17 AM Performed by: Marvalene Barrett D, CRNA Pre-anesthesia Checklist: Patient identified, Emergency Drugs available, Suction available and Patient being monitored Patient Re-evaluated:Patient Re-evaluated prior to induction Oxygen Delivery Method: Circle system utilized Preoxygenation: Pre-oxygenation with 100% oxygen Induction Type: IV induction Ventilation: Mask ventilation without difficulty Laryngoscope Size: Mac and 4 Grade View: Grade I Tube type: Oral Tube size: 7.5 mm Number of attempts: 1 Airway Equipment and Method: Stylet Placement Confirmation: ETT inserted through vocal cords under direct vision,  positive ETCO2 and breath sounds checked- equal and bilateral Secured at: 22 cm Tube secured with: Tape Dental Injury: Teeth and Oropharynx as per pre-operative assessment

## 2019-10-11 NOTE — Interval H&P Note (Signed)
History and Physical Interval Note:  10/11/2019 7:54 AM  Brian Riddle  has presented today for surgery, with the diagnosis of RIGHT AND POSSIBLE LEFT INGUINAL RECURRENT HERNIAS.  The various methods of treatment have been discussed with the patient and family. After consideration of risks, benefits and other options for treatment, the patient has consented to  Procedure(s): LAPAROSCOPIC RIGHT AND POSSIBLE LEFT INGUINAL HERNIAS REPAIR (N/A) as a surgical intervention.  The patient's history has been reviewed, patient examined, no change in status, stable for surgery.  I have reviewed the patient's chart and labs.  Questions were answered to the patient's satisfaction.    I have re-reviewed the the patient's records, history, medications, and allergies.  I have re-examined the patient.  I again discussed intraoperative plans and goals of post-operative recovery.  The patient agrees to proceed.  Brian Riddle  1975/11/27 [id number]  Patient Care Team: Jani Gravel, MD as PCP - General (Internal Medicine)  Patient Active Problem List   Diagnosis Date Noted   Recurrent right inguinal hernia 08/16/2019   Left groin pain 04/01/2011    Past Medical History:  Diagnosis Date   Abdominal pain    groin area around [facility] that comes and goes    Anxiety    Arthritis    Back injury 1982 adn 2003   2003 lower back, 1982 midback   Chronic back pain    Chronic neck pain    Complication of anesthesia 1992   woke up during piliodional cyst excision, has permament metal retainer in mouth   Diverticulitis    GERD (gastroesophageal reflux disease)    rare diet controlled   Hyperlipidemia     Past Surgical History:  Procedure Laterality Date   CYSTECTOMY  1997   Rt leg   HERNIA REPAIR  1 ?[id number]   PILONIDAL CYST EXCISION  [id number]   WISDOM TOOTH EXTRACTION  2002    Social History   Socioeconomic History   Marital status: Single    Spouse name: Not on file   Number of children: Not  on file   Years of education: Not on file   Highest education level: Not on file  Occupational History   Not on file  Tobacco Use   Smoking status: Former Smoker    Packs/day: 1.00    Years: 20.00    Pack years: 20.00    Types: Cigarettes    Quit date: 11/30/2018    Years since quitting: 0.8   Smokeless tobacco: Never Used  Substance and Sexual Activity   Alcohol use: Yes    Comment: rare   Drug use: No   Sexual activity: Not on file  Other Topics Concern   Not on file  Social History Narrative   Not on file   Social Determinants of Health   Financial Resource Strain:    Difficulty of Paying Living Expenses:   Food Insecurity:    Worried About Charity fundraiser in the Last Year:    Arboriculturist in the Last Year:   Transportation Needs:    Film/video editor (Medical):    Lack of Transportation (Non-Medical):   Physical Activity:    Days of Exercise per Week:    Minutes of Exercise per Session:   Stress:    Feeling of Stress :   Social Connections:    Frequency of Communication with Friends and Family:    Frequency of Social Gatherings with Friends and Family:    Attends Religious  Services:    Active Member of Clubs or Organizations:    Attends Music therapist:    Marital Status:   Intimate Partner Violence:    Fear of Current or Ex-Partner:    Emotionally Abused:    Physically Abused:    Sexually Abused:     History reviewed. No pertinent family history.  Medications Prior to Admission  Medication Sig Dispense Refill Last Dose   cetirizine (ZYRTEC) 10 MG tablet Take 10 mg by mouth daily.      Ascorbic Acid (VITAMIN C) 1000 MG tablet Take 1,000 mg by mouth daily.      aspirin 81 MG tablet Take 81 mg by mouth daily.        ibuprofen (ADVIL,MOTRIN) 200 MG tablet Take 800 mg by mouth every 6 (six) hours as needed for headache or moderate pain.       LORazepam (ATIVAN) 0.5 MG tablet Take 0.5 mg by mouth as needed for anxiety or sleep.        Multiple Vitamin (MULTIVITAMIN WITH MINERALS) TABS Take 1 tablet by mouth daily. Men's multiple vitamin      Omeprazole-Sodium Bicarbonate (ZEGERID) 20-1100 MG CAPS capsule Take 1 capsule by mouth daily as needed (heart burn).       vitamin B-12 (CYANOCOBALAMIN) 1000 MCG tablet Take 2,500 mcg by mouth daily.        Current Facility-Administered Medications  Medication Dose Route Frequency Provider Last Rate Last Admin   acetaminophen (TYLENOL) tablet 1,000 mg  1,000 mg Oral On Call to OR Michael Boston, MD       bupivacaine liposome (EXPAREL) 1.3 % injection 266 mg  20 mL Infiltration Once Michael Boston, MD       celecoxib (CELEBREX) 400 MG capsule 400 mg  400 mg Oral On Call to OR Michael Boston, MD       Chlorhexidine Gluconate Cloth 2 % PADS 6 each  6 each Topical Once Michael Boston, MD       And   Chlorhexidine Gluconate Cloth 2 % PADS 6 each  6 each Topical Once Michael Boston, MD       clindamycin (CLEOCIN) IVPB 900 mg  900 mg Intravenous On Call to OR Michael Boston, MD       And   gentamicin (GARAMYCIN) 5 mg/kg in dextrose 5 % 100 mL IVPB  5 mg/kg Intravenous On Call to OR Michael Boston, MD       gabapentin (NEURONTIN) capsule 300 mg  300 mg Oral On Call to OR Michael Boston, MD       lactated ringers infusion   Intravenous Continuous Myrtie Soman, MD         Allergies  Allergen Reactions   Penicillins Anaphylaxis    Hives, and difficulty breathing    Metronidazole     Neurological s/s (twitching)   Other     Bee stings swelling and vomiting    Ht 5\' 9"  (1.753 m)   Wt 102.5 kg   BMI 33.37 kg/m   Labs: No results found for this or any previous visit (from the past 48 hour(s)).  Imaging / Studies: No results found.   Adin Hector, M.D., F.A.C.S. Gastrointestinal and Minimally Invasive Surgery [facility] [address] [place], [place] [address] [phone] Main / Paging  10/11/2019 7:54 AM    Adin Hector

## 2019-10-11 NOTE — Transfer of Care (Signed)
Immediate Anesthesia Transfer of Care Note  Patient: EMER KAN  Procedure(s) Performed: LAPAROSCOPIC RIGHT AND LEFT INGUINAL HERNIAS REPAIR (N/A Abdomen)  Patient Location: PACU  Anesthesia Type:General  Level of Consciousness: awake, alert  and oriented  Airway & Oxygen Therapy: Patient Spontanous Breathing and Patient connected to nasal cannula oxygen  Post-op Assessment: Report given to RN and Post -op Vital signs reviewed and stable  Post vital signs: Reviewed and stable  Last Vitals:  Vitals Value Taken Time  BP 137/91 10/11/19 1156  Temp 36.9 C 10/11/19 1156  Pulse 77 10/11/19 1159  Resp 16 10/11/19 1159  SpO2 99 % 10/11/19 1159  Vitals shown include unvalidated device data.  Last Pain:  Vitals:   10/11/19 0813  TempSrc: Oral  PainSc: 0-No pain      Patients Stated Pain Goal: 4 [id number]  Complications: No apparent anesthesia complications

## 2019-10-11 NOTE — H&P (Signed)
Brian Riddle  Documented: 07/30/2019 9:45 AM  Location: [facility]  Patient #: [id number]  DOB: [birth year]  Married / Language: Brian Riddle / Race: White  Male  History of Present Illness Brian Hector MD; 07/30/2019 11:19 AM)  The patient is a 44 year old male who presents with a pilonidal cyst. Note for "Pilonidal cyst": `  `  `  Patient sent for surgical consultation at the request of Dr. Jani Gravel with Brian Riddle.  Chief Complaint: Right groin swelling. Probable right inguinal hernia. History of pilonidal disease with questionable flare versus anal fissure.  `  `  The patient is an active male that has seen our group in the past. Had some groin discomfort. Seen by Dr. Georgette Dover in 2013. No evidence of definite hernia. Then saw me 4 years ago for concern of right groin discomfort and possible recurrent pilonidal disease. The time I cannot feel a definite hernia but recommended follow-up if it worsened. Did not see any major pilonidal disease so held off on a more aggressive intervention. He seemed reassured.  More recently he noted he get some irritation in his intergluteal cleft. Wondered if his pilonidal disease has come back. He see some occasional topical triple antibiotic cream and occasionally some oral Bactrim her doxycycline which helps. Several months ago. He did note that the right groin has an obvious bulge now. He did have a CAT scan last year during if flare of diverticulitis. Right inguinal hernia obvious on that CT scan 2019. Patient recently had a colonoscopy by Dr. Benson Norway 2 weeks ago. He felt like he got another flare diverticulitis and was placed on a short course of oral antibiotics. He did have some rash and irritation but that seems to calm down. He thinks he's had probably about 6 attacks of diverticulitis. He claims to be admitted for them without an abscess. I see no record in the past 10 years in our system. He normally moves his bowels once a day. He  has been on nicotine patch and does not smoke anymore. No problems with urination or defecation. He does note that he will get some right perineal pain with intense physical activity including sexual intercourse. Thinks it may get some groin and testicular pain at the same time as well but is not certain. He was concerned that maybe a recurrent pilonidal disease or disease had some mild irritation over his tailbone.  (Review of systems as stated in this history (HPI) or in the review of systems. Otherwise all other 12 point ROS are negative)  `  `  This patient encounter took 60 minutes today to perform the following: obtain history, perform exam, review outside records, interpret tests & imaging, counsel the patient on their diagnosis; and, document this encounter, including findings & plan in the electronic health record (EHR).  Past Surgical History Antonietta Jewel, [place]; 07/30/2019 9:45 AM)  Open Inguinal Hernia Surgery Bilateral. multiple  Oral Surgery  Diagnostic Studies History (Chanel Teressa Senter, CMA; 07/30/2019 9:45 AM)  Colonoscopy within last year  never  Allergies Antonietta Jewel, CMA; 07/30/2019 9:46 AM)  Flagyl *ANTI-INFECTIVE AGENTS - MISC.*  Metronidazole (Topical) *DERMATOLOGICALS*  Penicillin V Potassium *PENICILLINS*  BEE VENOM  Allergies Reconciled  Medication History (Chanel Teressa Senter, CMA; 07/30/2019 9:46 AM)  Ativan (0.5MG  Tablet, Oral) Active.  Medications Reconciled  Social History Antonietta Jewel, CMA; 07/30/2019 9:45 AM)  Alcohol use Occasional alcohol use.  Caffeine use Coffee.  No drug use  Tobacco use Current every day  smoker, Former smoker.  Family History ([place], [place]; 07/30/2019 9:45 AM)  Alcohol Abuse Father.  Arthritis Mother.  Diabetes Mellitus Mother.  Heart disease in male family member before age 67  Other Problems (Chanel Teressa Senter, [place]; 07/30/2019 9:45 AM)  Anxiety Disorder  Back Pain  Diverticulosis  Hypercholesterolemia  Inguinal Hernia  Kidney Stone  Other  disease, cancer, significant illness  Review of Systems (Chanel Nolan CMA; 07/30/2019 9:45 AM)  General Not Present- Appetite Loss, Chills, Fatigue, Fever, Night Sweats, Weight Gain and Weight Loss.  Skin Present- Rash. Not Present- Change in Wart/Mole, Dryness, Hives, Jaundice, New Lesions, Non-Healing Wounds and Ulcer.  HEENT Present- Seasonal Allergies and Wears glasses/contact lenses. Not Present- Earache, Hearing Loss, Hoarseness, Nose Bleed, Oral Ulcers, Ringing in the Ears, Sinus Pain, Sore Throat, Visual Disturbances and Yellow Eyes.  Respiratory Not Present- Bloody sputum, Chronic Cough, Difficulty Breathing, Snoring and Wheezing.  Breast Not Present- Breast Mass, Breast Pain, Nipple Discharge and Skin Changes.  Cardiovascular Not Present- Chest Pain, Difficulty Breathing Lying Down, Leg Cramps, Palpitations, Rapid Heart Rate, Shortness of Breath and Swelling of Extremities.  Gastrointestinal Not Present- Abdominal Pain, Bloating, Bloody Stool, Change in Bowel Habits, Chronic diarrhea, Constipation, Difficulty Swallowing, Excessive gas, Gets full quickly at meals, Hemorrhoids, Indigestion, Nausea, Rectal Pain and Vomiting.  Male Genitourinary Not Present- Blood in Urine, Change in Urinary Stream, Frequency, Impotence, Nocturia, Painful Urination, Urgency and Urine Leakage.  Musculoskeletal Present- Back Pain. Not Present- Joint Pain, Joint Stiffness, Muscle Pain, Muscle Weakness and Swelling of Extremities.  Neurological Not Present- Decreased Memory, Fainting, Headaches, Numbness, Seizures, Tingling, Tremor, Trouble walking and Weakness.  Psychiatric Present- Anxiety. Not Present- Bipolar, Change in Sleep Pattern, Depression, Fearful and Frequent crying.  Endocrine Not Present- Cold Intolerance, Excessive Hunger, Hair Changes, Heat Intolerance, Hot flashes and New Diabetes.  Hematology Not Present- Blood Thinners, Easy Bruising, Excessive bleeding, Gland problems, HIV and Persistent Infections.   Vitals (Chanel Nolan CMA; 07/30/2019 9:47 AM)  07/30/2019 9:46 AM  Weight: 224.25 lb Height: 69 in  Body Surface Area: 2.17 m Body Mass Index: 33.12 kg/m  Temp.: 99.4 F Pulse: 101 (Regular)  BP: 110/62 (Sitting, Left Arm, Standard)  Physical Exam Brian Hector MD; 07/30/2019 11:12 AM)  General  Mental Status - Alert.  General Appearance - Not in acute distress, Not Sickly.  Orientation - Oriented X3.  Hydration - Well hydrated.  Voice - Normal.  Integumentary  Global Assessment  Upon inspection and palpation of skin surfaces of the - Axillae: non-tender, no inflammation or ulceration, no drainage. and Distribution of scalp and body hair is normal.  General Characteristics  Temperature - normal warmth is noted.  Head and Neck  Head - normocephalic, atraumatic with no lesions or palpable masses.  Face  Global Assessment - atraumatic, no absence of expression.  Neck  Global Assessment - no abnormal movements, no bruit auscultated on the right, no bruit auscultated on the left, no decreased range of motion, non-tender.  Trachea - midline.  Thyroid  Gland Characteristics - non-tender.  Eye  Eyeball - Left - Extraocular movements intact, No Nystagmus - Left.  Eyeball - Right - Extraocular movements intact, No Nystagmus - Right.  Cornea - Left - No Hazy - Left.  Cornea - Right - No Hazy - Right.  Sclera/Conjunctiva - Left - No scleral icterus, No Discharge - Left.  Sclera/Conjunctiva - Right - No scleral icterus, No Discharge - Right.  Pupil - Left - Direct reaction to light normal.  Pupil -  Right - Direct reaction to light normal.  ENMT  Ears  Pinna - Left - no drainage observed, no generalized tenderness observed. Pinna - Right - no drainage observed, no generalized tenderness observed.  Nose and Sinuses  External Inspection of the Nose - no destructive lesion observed. Inspection of the nares - Left - quiet respiration. Inspection of the nares - Right - quiet respiration.   Mouth and Throat  Lips - Upper Lip - no fissures observed, no pallor noted. Lower Lip - no fissures observed, no pallor noted. Nasopharynx - no discharge present. Oral Cavity/Oropharynx - Tongue - no dryness observed. Oral Mucosa - no cyanosis observed. Hypopharynx - no evidence of airway distress observed.  Chest and Lung Exam  Inspection  Movements - Normal and Symmetrical. Accessory muscles - No use of accessory muscles in breathing.  Palpation  Palpation of the chest reveals - Non-tender.  Auscultation  Breath sounds - Normal and Clear.  Cardiovascular  Auscultation  Rhythm - Regular. Murmurs & Other Heart Sounds - Auscultation of the heart reveals - No Murmurs and No Systolic Clicks.  Abdomen  Inspection  Inspection of the abdomen reveals - No Visible peristalsis and No Abnormal pulsations. Umbilicus - No Bleeding, No Urine drainage.  Palpation/Percussion  Palpation and Percussion of the abdomen reveal - Soft, Non Tender, No Rebound tenderness, No Rigidity (guarding) and No Cutaneous hyperesthesia.  Note: Abdomen soft. Not obese. No diastases recti. Nontender, nondistended. No guarding. No umbilical hernias  Male Genitourinary  Sexual Maturity  Tanner 5 - Adult hair pattern and Adult penile size and shape.  Note: Obvious right groin hernia sensitive a reducible. Impulse on left groin was sensitivity suspicious for small left inguinal hernia as well. This correlates with CT scan 2019. Bilateral groin incisions consistent with his inguinal hernia repairs done when he was an infant. Normal external genitalia. Epididymi, testes, and spermatic cords normal without any masses.  Rectal  Note: Scarring in the intergluteal cleft upper midline. No pits. However narrow 12 x 1 mm crack of maceration in the lower intragluteal cleft. Superficial left medial gluteal irritation. No purulence or fluctuance. No midline pits. No fluctuance. No erythema. No warmth. No drainage. No pain. No sinus. Moderate  hair burden in a deep intergluteal cleft - I plucked away. Consistent with excision of prior pilonidal disease.  Perianal skin clear. No fissure. Internal sphincter tone. No abscess. No external hemorrhoids. Tolerated digital and anoscopic exam. Grade 1-2 internal hemorrhoids without irritation. No prolapse. Proctitis. Anterior midline anal canal scarring consistent with possible prior healed fissure. No definite condyloma or abscess.  Peripheral Vascular  Upper Extremity  Inspection - Left - No Cyanotic nailbeds - Left, Not Ischemic. Inspection - Right - No Cyanotic nailbeds - Right, Not Ischemic.  Neurologic  Neurologic evaluation reveals - normal attention span and ability to concentrate, able to name objects and repeat phrases. Appropriate fund of knowledge , normal sensation and normal coordination.  Mental Status  Affect - not angry, not paranoid.  Cranial Nerves - Normal Bilaterally.  Gait - Normal.  Neuropsychiatric  Mental status exam performed with findings of - able to articulate well with normal speech/language, rate, volume and coherence, thought content normal with ability to perform basic computations and apply abstract reasoning and no evidence of hallucinations, delusions, obsessions or homicidal/suicidal ideation.  Musculoskeletal  Global Assessment  Spine, Ribs and Pelvis - no instability, subluxation or laxity. Right Upper Extremity - no instability, subluxation or laxity.  Lymphatic  Head & Neck  General Head & Neck Lymphatics: Bilateral - Description - No Localized lymphadenopathy.  Axillary  General Axillary Region: Bilateral - Description - No Localized lymphadenopathy.  Femoral & Inguinal  Generalized Femoral & Inguinal Lymphatics: Left - Description - No Localized lymphadenopathy. Right - Description - No Localized lymphadenopathy.  Results Brian Hector MD; 07/30/2019 11:20 AM)  Procedures  Name  Value  Date  Hemorrhoids  Procedure  Other: Scarring in the  intergluteal cleft upper midline. No pits. However narrow 12 x 1 mm crack of maceration in the lower intragluteal cleft. Superficial left medial gluteal irritation. No purulence or fluctuance. No midline pits. No fluctuance. No erythema. No warmth. No drainage. No pain. No sinus. Moderate hair burden in a deep intergluteal cleft - I plucked away. Consistent with excision of prior pilonidal disease............Marland KitchenPerianal skin clear. No fissure. Internal sphincter tone. No abscess. No external hemorrhoids. Tolerated digital and anoscopic exam. Grade 1-2 internal hemorrhoids without irritation. No prolapse. Proctitis. Anterior midline anal canal scarring consistent with possible prior healed fissure. No definite condyloma or abscess.  Performed: 07/30/2019 11:12 AM  Assessment & Plan Brian Hector MD; 07/30/2019 11:20 AM)  RECURRENT RIGHT INGUINAL HERNIA (K40.91)  Impression: Definite right inguinal hernia by exam and CT scan. Possible subtle one on the left side. Recurrent since he had prior pediatric inguinal hernia repairs when he was an infant  At some point, I think he would benefit from surgery to repair this. We do laparoscopic preperitoneal approach with repair of right side and examination possible repair of left side if inguinal hernia found there as well.  While he does have some irritation in the intergluteal cleft, I don't think he has any severe overactive pilonidal disease. I noted if he has purulence worsening discomfort, that needs to be addressed first minimize risk of infection. He feels likely inguinal hernias the more pressing issue but he wishes to discuss with his has been and will let us know.  He's leaning toward surgery which is discussed with his husband first. I gave him my card. He will call let us now.  Current Plans  Call back in 1 week with progress  PREOP - ING HERNIA - ENCOUNTER FOR PREOPERATIVE EXAMINATION FOR GENERAL SURGICAL PROCEDURE (Z01.818)  Current Plans  You are  being scheduled for surgery - Our schedulers will call you.  You should hear from our office's scheduling department within 5 working days about the location, date, and time of surgery. We try to make accommodations for patient's preferences in scheduling surgery, but sometimes the OR schedule or the surgeon's schedule prevents [place] from making those accommodations.  If you have not heard from our office [phone]) in 5 working days, call the office and ask for your surgeon's nurse.  If you have other questions about your diagnosis, plan, or surgery, call the office and ask for your surgeon's nurse.  Written instructions provided  The anatomy & physiology of the abdominal wall and pelvic floor was discussed. The pathophysiology of hernias in the inguinal and pelvic region was discussed. Natural history risks such as progressive enlargement, pain, incarceration, and strangulation was discussed. Contributors to complications such as smoking, obesity, diabetes, prior surgery, etc were discussed.  I feel the risks of no intervention will lead to serious problems that outweigh the operative risks; therefore, I recommended surgery to reduce and repair the hernia. I explained laparoscopic techniques with possible need for an open approach. I noted usual use of mesh to patch and/or buttress hernia repair  Risks such as bleeding, infection, abscess, need for further treatment, heart attack, death, and other risks were discussed. I noted a good likelihood this will help address the problem. Goals of post-operative recovery were discussed as well. Possibility that this will not correct all symptoms was explained. I stressed the importance of low-impact activity, aggressive pain control, avoiding constipation, & not pushing through pain to minimize risk of post-operative chronic pain or injury. Possibility of reherniation was discussed. We will work to minimize complications.  An educational handout further explaining  the pathology & treatment options was given as well. Questions were answered. The patient expresses understanding & wishes to proceed with surgery.  Pt Education - Pamphlet Given - Laparoscopic Hernia Repair: discussed with patient and provided information.  Pt Education - [facility] Pain Control (Hyatt Capobianco)  Pt Education - [facility] Hernia Post-Op HCI (Kalli Greenfield): discussed with patient and provided information.  Pt Education - [facility] Mesh education: discussed with patient and provided information.  TOBACCO ABUSE (Z72.0)  Impression: He has quit and is on a patch. That is a good sign. Hopefully that will allow her the chance of infection or recurrent pilonidal issues  Current Plans  Pt Education - CCS STOP SMOKING!  PILONIDAL CYST WITHOUT INFECTION (L05.91)  Impression: Evidence of prior surgery when he was a teenager for pilonidal disease. Some chronic irritation. I don't know if this is related recurrent pilonidal disease or not. Seems more like some skin breakdown and irritation more than recurrent pilonidal disease. Hasn't get a definite recent history but somewhat suspicious. Should he develop painful swelling abscess with purulent drainage or have progression despite antibiotics, he may require urgent office evaluation with possible incision and drainage and eventual operative plan reexcision. He would like to hold off on that. I wish to be safe and minimize risk of infection before doing the inguinal hernia repairs since mesh placement in standard to lower recurrence  Current Plans  Return to clinic as needed.  Soreness, decreased appetite, and poor energy level are common problems after surgery. While many people can struggle with a bad day, these concerns should gradually fade away or at least improve. Much of your recovery depends on your health & the severity of your operation. Please call if you have any further questions / concerns related to surgery.  Increase activity as tolerated to regular everyday activity.  Consider daily low impact exercise every day such as walking an hour a day.  Do not push through pain. If it hurts to do it, then don't do it.  Diet as tolerated. Low fat high fiber diet ideal. 30 g fiber a day ideal. Consider taking a daily fiber supplement to keep your bowels regular.  Followup with your primary care physician for other health issues as would normally be done.  Consider screening for malignancies (breast, prostate, colon, melanoma, etc) as appropriate. Discuss with you primary care physician.  The anatomy of the gluteal and sacral region was discussed. Pathophysiology of pilonidal disease due to ingrown hairs was discussed. Importance of good hygiene discussed. Importance of keeping hairs trimmed in the intergluteal crease from anus to top of sacrum/tailbone spine noted. Need for good hygiene stressed. Use of electric razor or nose hair trimmers to keep the hairs trimmed discussed.  Risk of worsening progression needing surgical drainage of abscess or perhaps excision of chronic pilonidal disease with skin flap closure over drains discussed. Possible need for her to leave it open with packing to allow the wound close over several weeks/months discussed.  Risks benefits alternatives to surgery discussed as well. Risk of recurrent disease discussed. Patient was expressed understanding. Literature given to patient. We will see if we can control this without an operation first.  Pt Education - CCS Pilonidal Disease ([place])  ANOSCOPY, DIAGNOSTIC [id number])  DIVERTICULITIS OF LARGE INTESTINE WITHOUT PERFORATION OR ABSCESS WITHOUT BLEEDING (K57.32)  Impression: History of pain and diverticulitis with CT scan documenting diverticulitis in the past. He claims his had 6 attacks. Probable mild flare with his colonoscopy 2 weeks ago. Resolved now. That is the most recent flare and several years. Followed by gastroenterology.  Should he develop evidence of stricture or recurrent/chronic attacks, he may  benefit from sigmoid colectomy. Table this issue since it is not pressing. He seems reassured.  Current Plans  Pt Education - C[facility] Diverticular Disease ([place])  Addendum: Patient is interested in proceeding with hernia surgery. Will place orders.  Electronically signed by Michael Boston, MD at 08/16/2019 2:47 PM

## 2019-10-11 NOTE — Anesthesia Postprocedure Evaluation (Signed)
Anesthesia Post Note  Patient: Brian Riddle  Procedure(s) Performed: LAPAROSCOPIC RIGHT AND LEFT INGUINAL HERNIAS REPAIR (N/A Abdomen)     Patient location during evaluation: PACU Anesthesia Type: General Level of consciousness: awake and alert Pain management: pain level controlled Vital Signs Assessment: post-procedure vital signs reviewed and stable Respiratory status: spontaneous breathing, nonlabored ventilation, respiratory function stable and patient connected to nasal cannula oxygen Cardiovascular status: blood pressure returned to baseline and stable Postop Assessment: no apparent nausea or vomiting Anesthetic complications: no    Last Vitals:  Vitals:   10/11/19 1326 10/11/19 1330  BP: 124/80   Pulse: 77 78  Resp: 12 (!) 8  Temp:    SpO2: 99% 99%    Last Pain:  Vitals:   10/11/19 1338  TempSrc:   PainSc: 4                  Koa Zoeller P Andon Villard

## 2019-10-11 NOTE — Anesthesia Preprocedure Evaluation (Addendum)
Anesthesia Evaluation  Patient identified by MRN, date of birth, ID band Patient awake    Reviewed: Allergy & Precautions, NPO status , Patient's Chart, lab work & pertinent test results  Airway Mallampati: II  TM Distance: >3 FB Neck ROM: Full    Dental no notable dental hx.    Pulmonary former smoker,    Pulmonary exam normal breath sounds clear to auscultation       Cardiovascular negative cardio ROS Normal cardiovascular exam Rhythm:Regular Rate:Normal     Neuro/Psych Anxiety negative neurological ROS     GI/Hepatic Neg liver ROS, GERD  Medicated and Controlled,  Endo/Other  negative endocrine ROS  Renal/GU negative Renal ROS     Musculoskeletal  (+) Arthritis , Back injury   Abdominal (+) + obese,   Peds  Hematology HLD   Anesthesia Other Findings RIGHT AND POSSIBLE LEFT INGUINAL RECURRENT HERNIAS  Reproductive/Obstetrics                            Anesthesia Physical Anesthesia Plan  ASA: II  Anesthesia Plan: General   Post-op Pain Management:    Induction: Intravenous  PONV Risk Score and Plan: 3 and Ondansetron, Dexamethasone, Midazolam and Treatment may vary due to age or medical condition  Airway Management Planned: Oral ETT  Additional Equipment:   Intra-op Plan:   Post-operative Plan: Extubation in OR  Informed Consent: I have reviewed the patients History and Physical, chart, labs and discussed the procedure including the risks, benefits and alternatives for the proposed anesthesia with the patient or authorized representative who has indicated his/her understanding and acceptance.     Dental advisory given  Plan Discussed with: CRNA  Anesthesia Plan Comments:        Anesthesia Quick Evaluation

## 2020-04-18 DIAGNOSIS — I10 Essential (primary) hypertension: Secondary | ICD-10-CM | POA: Diagnosis not present

## 2020-04-18 DIAGNOSIS — E782 Mixed hyperlipidemia: Secondary | ICD-10-CM | POA: Diagnosis not present

## 2020-04-18 DIAGNOSIS — E039 Hypothyroidism, unspecified: Secondary | ICD-10-CM | POA: Diagnosis not present

## 2020-04-18 DIAGNOSIS — Z0000 Encounter for general adult medical examination without abnormal findings: Secondary | ICD-10-CM | POA: Diagnosis not present

## 2020-04-18 DIAGNOSIS — Z125 Encounter for screening for malignant neoplasm of prostate: Secondary | ICD-10-CM | POA: Diagnosis not present

## 2020-04-25 DIAGNOSIS — Z0000 Encounter for general adult medical examination without abnormal findings: Secondary | ICD-10-CM | POA: Diagnosis not present

## 2021-01-07 DIAGNOSIS — R5383 Other fatigue: Secondary | ICD-10-CM | POA: Diagnosis not present

## 2021-01-07 DIAGNOSIS — Z113 Encounter for screening for infections with a predominantly sexual mode of transmission: Secondary | ICD-10-CM | POA: Diagnosis not present

## 2021-01-07 DIAGNOSIS — R6882 Decreased libido: Secondary | ICD-10-CM | POA: Diagnosis not present

## 2021-01-07 DIAGNOSIS — L0591 Pilonidal cyst without abscess: Secondary | ICD-10-CM | POA: Diagnosis not present

## 2021-01-07 DIAGNOSIS — N529 Male erectile dysfunction, unspecified: Secondary | ICD-10-CM | POA: Diagnosis not present

## 2021-01-09 DIAGNOSIS — K121 Other forms of stomatitis: Secondary | ICD-10-CM | POA: Diagnosis not present

## 2021-03-07 ENCOUNTER — Other Ambulatory Visit: Payer: Self-pay

## 2021-03-07 ENCOUNTER — Emergency Department (HOSPITAL_COMMUNITY): Payer: BC Managed Care – PPO

## 2021-03-07 ENCOUNTER — Encounter (HOSPITAL_COMMUNITY): Payer: Self-pay

## 2021-03-07 ENCOUNTER — Emergency Department (HOSPITAL_COMMUNITY)
Admission: EM | Admit: 2021-03-07 | Discharge: 2021-03-07 | Disposition: A | Discharge status: Home / Self Care | Payer: BC Managed Care – PPO | Attending: Student | Admitting: Student

## 2021-03-07 DIAGNOSIS — K219 Gastro-esophageal reflux disease without esophagitis: Secondary | ICD-10-CM | POA: Diagnosis not present

## 2021-03-07 DIAGNOSIS — Z87891 Personal history of nicotine dependence: Secondary | ICD-10-CM | POA: Diagnosis not present

## 2021-03-07 DIAGNOSIS — N281 Cyst of kidney, acquired: Secondary | ICD-10-CM | POA: Diagnosis not present

## 2021-03-07 DIAGNOSIS — K6389 Other specified diseases of intestine: Secondary | ICD-10-CM | POA: Diagnosis not present

## 2021-03-07 DIAGNOSIS — Z7982 Long term (current) use of aspirin: Secondary | ICD-10-CM | POA: Diagnosis not present

## 2021-03-07 DIAGNOSIS — K5792 Diverticulitis of intestine, part unspecified, without perforation or abscess without bleeding: Secondary | ICD-10-CM | POA: Diagnosis not present

## 2021-03-07 DIAGNOSIS — K5732 Diverticulitis of large intestine without perforation or abscess without bleeding: Secondary | ICD-10-CM | POA: Diagnosis not present

## 2021-03-07 DIAGNOSIS — R110 Nausea: Secondary | ICD-10-CM | POA: Diagnosis not present

## 2021-03-07 DIAGNOSIS — R1032 Left lower quadrant pain: Secondary | ICD-10-CM | POA: Diagnosis not present

## 2021-03-07 LAB — CBC WITH DIFFERENTIAL/PLATELET
Abs Immature Granulocytes: 0.04 10*3/uL (ref 0.00–0.07)
Basophils Absolute: 0 10*3/uL (ref 0.0–0.1)
Basophils Relative: 0 %
Eosinophils Absolute: 0.2 10*3/uL (ref 0.0–0.5)
Eosinophils Relative: 1 %
HCT: 49.4 % (ref 39.0–52.0)
Hemoglobin: 16.7 g/dL (ref 13.0–17.0)
Immature Granulocytes: 0 %
Lymphocytes Relative: 18 %
Lymphs Abs: 2.2 10*3/uL (ref 0.7–4.0)
MCH: 31.4 pg (ref 26.0–34.0)
MCHC: 33.8 g/dL (ref 30.0–36.0)
MCV: 92.9 fL (ref 80.0–100.0)
Monocytes Absolute: 1.3 10*3/uL — ABNORMAL HIGH (ref 0.1–1.0)
Monocytes Relative: 11 %
Neutro Abs: 8.6 10*3/uL — ABNORMAL HIGH (ref 1.7–7.7)
Neutrophils Relative %: 70 %
Platelets: 232 10*3/uL (ref 150–400)
RBC: 5.32 MIL/uL (ref 4.22–5.81)
RDW: 13.7 % (ref 11.5–15.5)
WBC: 12.4 10*3/uL — ABNORMAL HIGH (ref 4.0–10.5)
nRBC: 0 % (ref 0.0–0.2)

## 2021-03-07 LAB — COMPREHENSIVE METABOLIC PANEL
ALT: 27 U/L (ref 0–44)
AST: 22 U/L (ref 15–41)
Albumin: 4.6 g/dL (ref 3.5–5.0)
Alkaline Phosphatase: 55 U/L (ref 38–126)
Anion gap: 9 (ref 5–15)
BUN: 15 mg/dL (ref 6–20)
CO2: 28 mmol/L (ref 22–32)
Calcium: 9.9 mg/dL (ref 8.9–10.3)
Chloride: 107 mmol/L (ref 98–111)
Creatinine, Ser: 0.86 mg/dL (ref 0.61–1.24)
GFR, Estimated: >60 mL/min (ref >60)
Glucose, Bld: 86 mg/dL (ref 70–99)
Potassium: 4.3 mmol/L (ref 3.5–5.1)
Sodium: 144 mmol/L (ref 135–145)
Total Bilirubin: 0.7 mg/dL (ref 0.3–1.2)
Total Protein: 7.8 g/dL (ref 6.5–8.1)

## 2021-03-07 LAB — LIPASE, BLOOD: Lipase: 41 U/L (ref 11–51)

## 2021-03-07 MED ORDER — ONDANSETRON HCL 4 MG/2ML IJ SOLN
4.0000 mg | Freq: Once | INTRAMUSCULAR | Status: AC
  Administered 2021-03-07: 4 mg via INTRAVENOUS
  Filled 2021-03-07: qty 2

## 2021-03-07 MED ORDER — MOXIFLOXACIN HCL 400 MG PO TABS: 400.0000 mg | ORAL_TABLET | Freq: Every day | ORAL | 0 refills | Status: AC

## 2021-03-07 MED ORDER — MORPHINE SULFATE (PF) 4 MG/ML IV SOLN
4.0000 mg | Freq: Once | INTRAVENOUS | Status: AC
  Administered 2021-03-07: 4 mg via INTRAVENOUS
  Filled 2021-03-07: qty 1

## 2021-03-07 MED ORDER — IOHEXOL 350 MG/ML SOLN
80.0000 mL | Freq: Once | INTRAVENOUS | Status: AC | PRN
  Administered 2021-03-07: 80 mL via INTRAVENOUS

## 2021-03-07 NOTE — ED Triage Notes (Signed)
PT c/o lower abdominal pain and nausea since this morning. Reports hx of diverticulitis. Pt states he noticed a small amount of bright red blood in his stool as well.

## 2021-03-07 NOTE — ED Provider Notes (Signed)
Emergency Medicine Provider Triage Evaluation Note  DWANYE TIPPENS , a 45 y.o. male  was evaluated in triage.  Pt complains of feeling like he has diverticulitis.  He states that since this morning he has had midline lower abdominal pain.  It woke him up at three am.  He reports nausea with out vomiting.  He did have some bright red blood in his stool.  He denies any changes to urination.   Review of Systems  Positive: Abdominal pain, small amount of bright red blood in stool and when he wiped.  Negative: Vomiting, fevers   Physical Exam  BP (!) 145/93   Pulse 100   Temp 98.1 F (36.7 C)   Resp 19   SpO2 99%  Gen:   Awake, no distress  Resp:  Normal effort  MSK:   Moves extremities without difficulty  Other:  Lower Abd is TTP   Medical Decision Making  Medically screening exam initiated at 4:49 PM.  Appropriate orders placed.  KEESHAWN WIKER was informed that the remainder of the evaluation will be completed by another provider, this initial triage assessment does not replace that evaluation, and the importance of remaining in the ED until their evaluation is complete.  Note: Portions of this report may have been transcribed using voice recognition software. Every effort was made to ensure accuracy; however, inadvertent computerized transcription errors may be present.     Lorin Glass, PA-C 03/07/21 1656    Teressa Lower, MD 03/07/21 2250

## 2021-03-07 NOTE — ED Provider Notes (Signed)
[facility] DEPT Provider Note   CSN: [id number] Arrival date & time: 03/07/21  1629     History No chief complaint on file.   Brian Riddle is a 45 y.o. male with PMH recurrent diverticulitis who presents to the emergency department for evaluation of acute onset left lower quadrant pain.  Patient states that he woke around 2 AM with gradually worsening left lower quadrant abdominal pain and associated nausea.  Denies vomiting.  He also endorses streaks of blood in brown stool which is abnormal for him.  Denies chest pain, shortness of breath, fever, headache, cough or other systemic symptoms.  HPI     Past Medical History:  Diagnosis Date   Abdominal pain    groin area around [facility] that comes and goes    Anxiety    Arthritis    Back injury 1982 adn 2003   2003 lower back, 1982 midback   Chronic back pain    Chronic neck pain    Complication of anesthesia 1992   woke up during piliodional cyst excision, has permament metal retainer in mouth   Diverticulitis    GERD (gastroesophageal reflux disease)    rare diet controlled   Hyperlipidemia     Patient Active Problem List   Diagnosis Date Noted   Bilateral recurrent inguinal hernias s/p lap repair with mesh 08/16/2019   Left groin pain 04/01/2011    Past Surgical History:  Procedure Laterality Date   CYSTECTOMY  1997   Rt leg   HERNIA REPAIR  1 ?[id number]   INGUINAL HERNIA REPAIR N/A 10/11/2019   Procedure: LAPAROSCOPIC RIGHT AND LEFT INGUINAL HERNIAS REPAIR;  Surgeon: Michael Boston, MD;  Location: [facility];  Service: General;  Laterality: N/A;   PILONIDAL CYST EXCISION  [id number]   WISDOM TOOTH EXTRACTION  2002       No family history on file.  Social History   Tobacco Use   Smoking status: Former    Packs/day: 1.00    Years: 20.00    Pack years: 20.00    Types: Cigarettes    Quit date: 11/30/2018    Years since quitting: 2.2   Smokeless tobacco: Never   Vaping Use   Vaping Use: Never used  Substance Use Topics   Alcohol use: Yes    Comment: rare   Drug use: No    Home Medications Prior to Admission medications   Medication Sig Start Date End Date Taking? Authorizing Provider  Ascorbic Acid (VITAMIN C) 1000 MG tablet Take 1,000 mg by mouth daily.   Yes [provider]  aspirin EC 81 MG tablet Take 81 mg by mouth daily. Swallow whole.   Yes [provider]  cetirizine (ZYRTEC) 10 MG tablet Take 10 mg by mouth daily.   Yes [provider]  Cholecalciferol (VITAMIN D3 PO) Take 1 tablet by mouth daily.   Yes [provider]  clonazePAM (KLONOPIN) 0.5 MG tablet Take 0.5 mg by mouth daily as needed for anxiety. 09/29/20  Yes [provider]  Cyanocobalamin 2500 MCG TABS Take 2,500 mcg by mouth daily. Vitamin B12   Yes [provider]  emtricitabine-tenofovir AF (DESCOVY) 200-25 MG tablet Take 1 tablet by mouth daily.   Yes [provider]  ibuprofen (ADVIL,MOTRIN) 200 MG tablet Take 800 mg by mouth 2 (two) times daily as needed for headache (pain).   Yes [provider]  Multiple Vitamin (MULTIVITAMIN WITH MINERALS) TABS Take 1 tablet by mouth daily.  Men's multiple vitamin   Yes [provider]  Omeprazole-Sodium Bicarbonate (ZEGERID) 20-1100 MG CAPS capsule Take 1 capsule by mouth daily as needed (heart burn).    Yes [provider]  PSYLLIUM PO Take 1-2 capsules by mouth See admin instructions. Take 2 capsules by mouth with lunch and 1 capsule with supper   Yes [provider]  TURMERIC PO Take 1-2 capsules by mouth See admin instructions. Take 2 capsules by mouth with lunch and 1 capsule with supper   Yes [provider]  vardenafil (LEVITRA) 20 MG tablet Take 20 mg by mouth daily as needed for erectile dysfunction. 11/20/20  Yes [provider]    Allergies    Penicillins, Bee venom, and Metronidazole  Review of Systems    Review of Systems  Constitutional:  Negative for chills and fever.  HENT:  Negative for ear pain and sore throat.   Eyes:  Negative for pain and visual disturbance.  Respiratory:  Negative for cough and shortness of breath.   Cardiovascular:  Negative for chest pain and palpitations.  Gastrointestinal:  Positive for abdominal pain and nausea. Negative for vomiting.  Genitourinary:  Negative for dysuria and hematuria.  Musculoskeletal:  Negative for arthralgias and back pain.  Skin:  Negative for color change and rash.  Neurological:  Negative for seizures and syncope.  All other systems reviewed and are negative.  Physical Exam Updated Vital Signs BP 135/84   Pulse 76   Temp 98.1 F (36.7 C)   Resp 18   SpO2 98%   Physical Exam Vitals and nursing note reviewed.  Constitutional:      Appearance: He is well-developed.  HENT:     Head: Normocephalic and atraumatic.  Eyes:     Conjunctiva/sclera: Conjunctivae normal.  Cardiovascular:     Rate and Rhythm: Normal rate and regular rhythm.     Heart sounds: No murmur heard. Pulmonary:     Effort: Pulmonary effort is normal. No respiratory distress.     Breath sounds: Normal breath sounds.  Abdominal:     Palpations: Abdomen is soft.     Tenderness: There is abdominal tenderness (LLQ).  Musculoskeletal:     Cervical back: Neck supple.  Skin:    General: Skin is warm and dry.  Neurological:     Mental Status: He is alert.    ED Results / Procedures / Treatments   Labs (all labs ordered are listed, but only abnormal results are displayed) Labs Reviewed  CBC WITH DIFFERENTIAL/PLATELET - Abnormal; Notable for the following components:      Result Value   WBC 12.4 (*)    Neutro Abs 8.6 (*)    Monocytes Absolute 1.3 (*)    All other components within normal limits  COMPREHENSIVE METABOLIC PANEL  LIPASE, BLOOD    EKG None  Radiology CT ABDOMEN PELVIS W CONTRAST  Result Date: 03/07/2021 CLINICAL DATA:  Left lower  quadrant pain, nausea, history of diverticulitis EXAM: CT ABDOMEN AND PELVIS WITH CONTRAST TECHNIQUE: Multidetector CT imaging of the abdomen and pelvis was performed using the standard protocol following bolus administration of intravenous contrast. CONTRAST:  5m OMNIPAQUE IOHEXOL 350 MG/ML SOLN COMPARISON:  07/22/2017 FINDINGS: Lower chest: Minimal dependent atelectasis in the bilateral lower lobes. Hepatobiliary: Liver is within normal limits. Gallbladder is unremarkable. No intrahepatic or extrahepatic ductal dilatation. Pancreas: Within normal limits. Spleen: Within normal limits. Adrenals/Urinary Tract: Adrenal glands are within normal limits. 3.0 cm cyst in the anterior left upper kidney (series 2/image  29). Right kidney is within normal limits. No hydronephrosis. Bladder is within normal limits. Stomach/Bowel: Stomach is within normal limits. No evidence of bowel obstruction. Normal appendix (series 2/image 63). Left colonic diverticulosis with sigmoid wall thickening and pericolonic stranding in the left lower quadrant (series 2/image 70), reflecting sigmoid diverticulitis. No drainable fluid collection/abscess. No free air to suggest macroscopic perforation. Vascular/Lymphatic: No evidence of abdominal aortic aneurysm. No suspicious abdominopelvic lymphadenopathy. Reproductive: Prostate is unremarkable. Other: Trace pelvic ascites (series 2/image 70). Musculoskeletal: Degenerative changes of the visualized thoracolumbar spine. IMPRESSION: Sigmoid diverticulitis. No drainable fluid collection/abscess. Trace pelvic ascites. No free air. Electronically Signed   By: Julian Hy M.D.   On: 03/07/2021 20:52    Procedures Procedures   Medications Ordered in ED Medications  morphine 4 MG/ML injection 4 mg (4 mg Intravenous Given 03/07/21 2029)  ondansetron (ZOFRAN) injection 4 mg (4 mg Intravenous Given 03/07/21 2027)  iohexol (OMNIPAQUE) 350 MG/ML injection 80 mL (80 mLs Intravenous Contrast Given  03/07/21 2040)    ED Course  I have reviewed the triage vital signs and the nursing notes.  Pertinent labs & imaging results that were available during my care of the patient were reviewed by me and considered in my medical decision making (see chart for details).    MDM Rules/Calculators/A&P                           Patient seen the emergency department for evaluation of left lower quadrant pain.  physical exam reveals left lower quadrant tenderness to palpation but is otherwise unremarkable.  Laboratory evaluation reveals a mild leukocytosis to 12.4 but is otherwise unremarkable.  CT abdomen pelvis shows uncomplicated nonperforated diverticulitis.  The patient has multiple medication allergies including anaphylaxis to penicillin and abnormal neuro symptoms with an apparent intolerance to Flagyl.  Thus, we will place the patient on moxifloxacin and he was discharged with a 7-day course.  As this is the patient's fourth episode of diverticulitis, he was encouraged to follow-up with his primary care physician to discuss possible outpatient surgical referral. Final Clinical Impression(s) / ED Diagnoses Final diagnoses:  Diverticulitis    Rx / DC Orders ED Discharge Orders     None        Banner Huckaba, Debe Coder, MD 03/07/21 2238

## 2021-03-24 DIAGNOSIS — R0982 Postnasal drip: Secondary | ICD-10-CM | POA: Diagnosis not present

## 2021-03-24 DIAGNOSIS — R059 Cough, unspecified: Secondary | ICD-10-CM | POA: Diagnosis not present

## 2021-03-24 DIAGNOSIS — Z20822 Contact with and (suspected) exposure to covid-19: Secondary | ICD-10-CM | POA: Diagnosis not present

## 2021-03-24 DIAGNOSIS — J029 Acute pharyngitis, unspecified: Secondary | ICD-10-CM | POA: Diagnosis not present

## 2021-03-27 DIAGNOSIS — J0101 Acute recurrent maxillary sinusitis: Secondary | ICD-10-CM | POA: Diagnosis not present

## 2021-03-27 DIAGNOSIS — R051 Acute cough: Secondary | ICD-10-CM | POA: Diagnosis not present

## 2021-04-09 DIAGNOSIS — N529 Male erectile dysfunction, unspecified: Secondary | ICD-10-CM | POA: Diagnosis not present

## 2021-04-09 DIAGNOSIS — L0591 Pilonidal cyst without abscess: Secondary | ICD-10-CM | POA: Diagnosis not present

## 2021-04-09 DIAGNOSIS — Z23 Encounter for immunization: Secondary | ICD-10-CM | POA: Diagnosis not present

## 2021-04-09 DIAGNOSIS — Z113 Encounter for screening for infections with a predominantly sexual mode of transmission: Secondary | ICD-10-CM | POA: Diagnosis not present

## 2021-04-09 DIAGNOSIS — E785 Hyperlipidemia, unspecified: Secondary | ICD-10-CM | POA: Diagnosis not present

## 2021-04-09 DIAGNOSIS — I10 Essential (primary) hypertension: Secondary | ICD-10-CM | POA: Diagnosis not present

## 2021-04-29 DIAGNOSIS — Z114 Encounter for screening for human immunodeficiency virus [HIV]: Secondary | ICD-10-CM | POA: Diagnosis not present

## 2021-04-29 DIAGNOSIS — Z113 Encounter for screening for infections with a predominantly sexual mode of transmission: Secondary | ICD-10-CM | POA: Diagnosis not present

## 2021-07-07 DIAGNOSIS — I10 Essential (primary) hypertension: Secondary | ICD-10-CM | POA: Diagnosis not present

## 2021-07-07 DIAGNOSIS — E039 Hypothyroidism, unspecified: Secondary | ICD-10-CM | POA: Diagnosis not present

## 2021-07-07 DIAGNOSIS — Z0000 Encounter for general adult medical examination without abnormal findings: Secondary | ICD-10-CM | POA: Diagnosis not present

## 2021-07-07 DIAGNOSIS — R7309 Other abnormal glucose: Secondary | ICD-10-CM | POA: Diagnosis not present

## 2021-07-07 DIAGNOSIS — Z125 Encounter for screening for malignant neoplasm of prostate: Secondary | ICD-10-CM | POA: Diagnosis not present

## 2021-07-07 DIAGNOSIS — E785 Hyperlipidemia, unspecified: Secondary | ICD-10-CM | POA: Diagnosis not present

## 2021-07-14 DIAGNOSIS — N529 Male erectile dysfunction, unspecified: Secondary | ICD-10-CM | POA: Diagnosis not present

## 2021-07-14 DIAGNOSIS — Z0000 Encounter for general adult medical examination without abnormal findings: Secondary | ICD-10-CM | POA: Diagnosis not present

## 2021-07-14 DIAGNOSIS — R7303 Prediabetes: Secondary | ICD-10-CM | POA: Diagnosis not present

## 2021-07-14 DIAGNOSIS — Z23 Encounter for immunization: Secondary | ICD-10-CM | POA: Diagnosis not present

## 2021-07-14 DIAGNOSIS — F419 Anxiety disorder, unspecified: Secondary | ICD-10-CM | POA: Diagnosis not present

## 2021-07-14 DIAGNOSIS — E782 Mixed hyperlipidemia: Secondary | ICD-10-CM | POA: Diagnosis not present

## 2021-09-30 ENCOUNTER — Encounter: Payer: Self-pay | Admitting: Gastroenterology

## 2021-10-12 DIAGNOSIS — I10 Essential (primary) hypertension: Secondary | ICD-10-CM | POA: Diagnosis not present

## 2021-10-12 DIAGNOSIS — R7303 Prediabetes: Secondary | ICD-10-CM | POA: Diagnosis not present

## 2021-10-12 DIAGNOSIS — R739 Hyperglycemia, unspecified: Secondary | ICD-10-CM | POA: Diagnosis not present

## 2021-10-12 DIAGNOSIS — E782 Mixed hyperlipidemia: Secondary | ICD-10-CM | POA: Diagnosis not present

## 2021-10-12 DIAGNOSIS — F419 Anxiety disorder, unspecified: Secondary | ICD-10-CM | POA: Diagnosis not present

## 2021-10-19 DIAGNOSIS — I10 Essential (primary) hypertension: Secondary | ICD-10-CM | POA: Diagnosis not present

## 2021-10-19 DIAGNOSIS — E782 Mixed hyperlipidemia: Secondary | ICD-10-CM | POA: Diagnosis not present

## 2021-10-19 DIAGNOSIS — F419 Anxiety disorder, unspecified: Secondary | ICD-10-CM | POA: Diagnosis not present

## 2021-10-19 DIAGNOSIS — F418 Other specified anxiety disorders: Secondary | ICD-10-CM | POA: Diagnosis not present

## 2021-10-20 ENCOUNTER — Encounter: Payer: Self-pay | Admitting: Gastroenterology

## 2021-10-20 ENCOUNTER — Ambulatory Visit: Payer: BC Managed Care – PPO | Admitting: Gastroenterology

## 2021-10-20 VITALS — BP 114/82 | HR 79 | Ht 69.0 in | Wt 243.2 lb

## 2021-10-20 DIAGNOSIS — K5732 Diverticulitis of large intestine without perforation or abscess without bleeding: Secondary | ICD-10-CM | POA: Diagnosis not present

## 2021-10-20 NOTE — Patient Instructions (Signed)
If you are age 48 or older, your body mass index should be between 23-30. Your Body mass index is 35.92 kg/m?Marland Kitchen If this is out of the aforementioned range listed, please consider follow up with your Primary Care Provider. ? ?If you are age 66 or younger, your body mass index should be between 19-25. Your Body mass index is 35.92 kg/m?Marland Kitchen If this is out of the aformentioned range listed, please consider follow up with your Primary Care Provider.  ? ?________________________________________________________ ? ?The [facility] providers would like to encourage you to use [facility] to communicate with providers for non-urgent requests or questions.  Due to long hold times on the telephone, sending your provider a message by [facility] may be a faster and more efficient way to get a response.  Please allow 48 business hours for a response.  Please remember that this is for non-urgent requests.  ?_______________________________________________________ ? ?Continue fiber ? ?Please follow up as needed ?

## 2021-10-20 NOTE — Progress Notes (Signed)
? ?HPI : Brian Riddle is a very pleasant 46 year old male who was referred to [place] by Deon Pilling for further evaluation of recurrent diverticulitis.  The patient has had at least 5 episodes of uncomplicated diverticulitis, in 2008, 2013, 2014, 2019 and most recently in September 2022.  His diverticulitis has been localized to the sigmoid or distal descending colon each time.  He has never had any abscesses or evidence of perforation.  He has had a few other episodes for which he just took antibiotics without going to the emergency department or getting a CT scan.  His episodes are characterized by progressively worsening and sharpening pain in the left lower quadrant/suprapubic area.  Sometimes associated with fevers and nausea.  He feels that the episodes have been less frequent in recent years.  He attributes this to improving his diet, increasing fiber and decreasing fatty foods.  He has been taking a psyllium fiber supplement daily for a few years now.  He did stop eating popcorn a few years ago because 2 of his episodes occurred within a few days of eating popcorn. ?He has resumed smoking, after stopping smoking few years ago.  He takes Motrin on occasion for headaches for back pain. ? ?He had a history of persistent abdominal pain following a complicated inguinal hernia repair, but this pain has finally resolved.  He denies any GI symptoms in between his bouts of diverticulitis.  He has regular bowel movements.  No problems with constipation, diarrhea or blood in the stool. ? ?He had a colonoscopy in January 2021 by Dr. Tarri Abernethy endoscopy.  He reports that there were no polyps and he was recommended to repeat colonoscopy in 10 years. ? ?Past Medical History:  ?Diagnosis Date  ? Abdominal pain   ? groin area around [facility] that comes and goes   ? Anxiety   ? Arthritis   ? Back injury 1982 adn 2003  ? 2003 lower back, 1982 midback  ? Chronic back pain   ? Chronic neck pain   ? Complication of anesthesia 1992   ? woke up during piliodional cyst excision, has permament metal retainer in mouth  ? Diverticulitis   ? GERD (gastroesophageal reflux disease)   ? rare diet controlled  ? Hyperlipidemia   ? ? ? ?Past Surgical History:  ?Procedure Laterality Date  ? CYSTECTOMY  1997  ? Rt leg  ? HERNIA REPAIR  1 ?[id number]  ? INGUINAL HERNIA REPAIR N/A 10/11/2019  ? Procedure: LAPAROSCOPIC RIGHT AND LEFT INGUINAL HERNIAS REPAIR;  Surgeon: Michael Boston, MD;  Location: [facility];  Service: General;  Laterality: N/A;  ? PILONIDAL CYST EXCISION  [id number]  ? [place] EXTRACTION  2002  ? ?No family history on file. ?Social History  ? ?Tobacco Use  ? Smoking status: Former  ?  Packs/day: 1.00  ?  Years: 20.00  ?  Pack years: 20.00  ?  Types: Cigarettes  ?  Quit date: 11/30/2018  ?  Years since quitting: 2.8  ? Smokeless tobacco: Never  ?Vaping Use  ? Vaping Use: Never used  ?Substance Use Topics  ? Alcohol use: Yes  ?  Comment: rare  ? Drug use: No  ? ?Current Outpatient Medications  ?Medication Sig Dispense Refill  ? Ascorbic Acid (VITAMIN C) 1000 MG tablet Take 1,000 mg by mouth daily.    ? aspirin EC 81 MG tablet Take 81 mg by mouth daily. Swallow whole.    ? cetirizine (ZYRTEC) 10 MG tablet Take  10 mg by mouth daily.    ? Cholecalciferol (VITAMIN D3 PO) Take 1 tablet by mouth daily.    ? clonazePAM (KLONOPIN) 0.5 MG tablet Take 0.5 mg by mouth daily as needed for anxiety.    ? Cyanocobalamin 2500 MCG TABS Take 2,500 mcg by mouth daily. Vitamin B12    ? emtricitabine-tenofovir AF (DESCOVY) 200-25 MG tablet Take 1 tablet by mouth daily.    ? ibuprofen (ADVIL,MOTRIN) 200 MG tablet Take 800 mg by mouth 2 (two) times daily as needed for headache (pain).    ? Multiple Vitamin (MULTIVITAMIN WITH MINERALS) TABS Take 1 tablet by mouth daily. Men's multiple vitamin    ? Omeprazole-Sodium Bicarbonate (ZEGERID) 20-1100 MG CAPS capsule Take 1 capsule by mouth daily as needed (heart burn).     ? PSYLLIUM PO Take 1-2 capsules by  mouth See admin instructions. Take 2 capsules by mouth with lunch and 1 capsule with supper    ? TURMERIC PO Take 1-2 capsules by mouth See admin instructions. Take 2 capsules by mouth with lunch and 1 capsule with supper    ? vardenafil (LEVITRA) 20 MG tablet Take 20 mg by mouth daily as needed for erectile dysfunction.    ? ?No current facility-administered medications for this visit.  ? ?Allergies  ?Allergen Reactions  ? Penicillins Anaphylaxis, Hives and Shortness Of Breath  ? Bee Venom Nausea And Vomiting and Swelling  ?  Swelling at injection site  ? Metronidazole Other (See Comments)  ?  Neurological s/s (twitching)  ? ? ? ?Review of Systems: ?All systems reviewed and negative except where noted in HPI.  ? ? ?No results found. ? ?Physical Exam: ?BP 114/82   Pulse 79   Ht '5\' 9"'$  (1.753 m)   Wt 243 lb 4 oz (110.3 kg)   BMI 35.92 kg/m?  ?Constitutional: Pleasant,well-developed, Caucasian male in no acute distress. ?HEENT: Normocephalic and atraumatic. Conjunctivae are normal. No scleral icterus. ?Neck supple.  ?Cardiovascular: Normal rate, regular rhythm.  ?Pulmonary/chest: Effort normal and breath sounds normal. No wheezing, rales or rhonchi. ?Abdominal: Soft, nondistended, nontender. Bowel sounds active throughout. There are no masses palpable. No hepatomegaly. ?Extremities: no edema ?Neurological: Alert and oriented to person place and time. ?Skin: Skin is warm and dry. No rashes noted. ?Psychiatric: Normal mood and affect. Behavior is normal. ? ?CBC ?   ?Component Value Date/Time  ? WBC 12.4 (H) 03/07/2021 1740  ? RBC 5.32 03/07/2021 1740  ? HGB 16.7 03/07/2021 1740  ? HCT 49.4 03/07/2021 1740  ? PLT 232 03/07/2021 1740  ? MCV 92.9 03/07/2021 1740  ? MCH 31.4 03/07/2021 1740  ? MCHC 33.8 03/07/2021 1740  ? RDW 13.7 03/07/2021 1740  ? LYMPHSABS 2.2 03/07/2021 1740  ? MONOABS 1.3 (H) 03/07/2021 1740  ? EOSABS 0.2 03/07/2021 1740  ? BASOSABS 0.0 03/07/2021 1740  ? ? ?CMP  ?   ?Component Value Date/Time  ? NA  144 03/07/2021 1825  ? K 4.3 03/07/2021 1825  ? CL 107 03/07/2021 1825  ? CO2 28 03/07/2021 1825  ? GLUCOSE 86 03/07/2021 1825  ? BUN 15 03/07/2021 1825  ? CREATININE 0.86 03/07/2021 1825  ? CALCIUM 9.9 03/07/2021 1825  ? PROT 7.8 03/07/2021 1825  ? ALBUMIN 4.6 03/07/2021 1825  ? AST 22 03/07/2021 1825  ? ALT 27 03/07/2021 1825  ? ALKPHOS 55 03/07/2021 1825  ? BILITOT 0.7 03/07/2021 1825  ? GFRNONAA >60 03/07/2021 1825  ? GFRAA >60 07/22/2017 0359  ? ?Narrative & Impression ?CLINICAL  DATA:  Left lower quadrant pain, nausea, history of ?diverticulitis ?  ?EXAM: ?CT ABDOMEN AND PELVIS WITH CONTRAST ?  ?TECHNIQUE: ?Multidetector CT imaging of the abdomen and pelvis was performed ?using the standard protocol following bolus administration of ?intravenous contrast. ?  ?CONTRAST:  43m OMNIPAQUE IOHEXOL 350 MG/ML SOLN ?  ?COMPARISON:  07/22/2017 ?  ?FINDINGS: ?Lower chest: Minimal dependent atelectasis in the bilateral lower ?lobes. ?  ?Hepatobiliary: Liver is within normal limits. ?  ?Gallbladder is unremarkable. No intrahepatic or extrahepatic ductal ?dilatation. ?  ?Pancreas: Within normal limits. ?  ?Spleen: Within normal limits. ?  ?Adrenals/Urinary Tract: Adrenal glands are within normal limits. ?  ?3.0 cm cyst in the anterior left upper kidney (series 2/image 29). ?Right kidney is within normal limits. No hydronephrosis. ?  ?Bladder is within normal limits. ?  ?Stomach/Bowel: Stomach is within normal limits. ?  ?No evidence of bowel obstruction. ?  ?Normal appendix (series 2/image 63). ?  ?Left colonic diverticulosis with sigmoid wall thickening and ?pericolonic stranding in the left lower quadrant (series 2/image ?725, reflecting sigmoid diverticulitis. No drainable fluid ?collection/abscess. No free air to suggest macroscopic perforation. ?  ?Vascular/Lymphatic: No evidence of abdominal aortic aneurysm. ?  ?No suspicious abdominopelvic lymphadenopathy. ?  ?Reproductive: Prostate is unremarkable. ?  ?Other: Trace  pelvic ascites (series 2/image 70). ?  ?Musculoskeletal: Degenerative changes of the visualized ?thoracolumbar spine. ?  ?IMPRESSION: ?Sigmoid diverticulitis. No drainable fluid collection/abscess. Trace ?

## 2021-11-05 ENCOUNTER — Other Ambulatory Visit: Payer: Self-pay

## 2021-11-05 ENCOUNTER — Encounter (HOSPITAL_COMMUNITY): Payer: Self-pay

## 2021-11-05 ENCOUNTER — Emergency Department (HOSPITAL_BASED_OUTPATIENT_CLINIC_OR_DEPARTMENT_OTHER)
Admission: EM | Admit: 2021-11-05 | Discharge: 2021-11-05 | Disposition: A | Discharge status: Home / Self Care | Payer: BC Managed Care – PPO | Source: Home / Self Care

## 2021-11-05 ENCOUNTER — Emergency Department (HOSPITAL_COMMUNITY)
Admission: EM | Admit: 2021-11-05 | Discharge: 2021-11-05 | Disposition: A | Discharge status: Home / Self Care | Payer: BC Managed Care – PPO | Attending: Emergency Medicine | Admitting: Emergency Medicine

## 2021-11-05 DIAGNOSIS — Z7982 Long term (current) use of aspirin: Secondary | ICD-10-CM | POA: Insufficient documentation

## 2021-11-05 DIAGNOSIS — M79604 Pain in right leg: Secondary | ICD-10-CM | POA: Diagnosis not present

## 2021-11-05 LAB — CBC WITH DIFFERENTIAL/PLATELET
Abs Immature Granulocytes: 0.03 10*3/uL (ref 0.00–0.07)
Basophils Absolute: 0 10*3/uL (ref 0.0–0.1)
Basophils Relative: 0 %
Eosinophils Absolute: 0.1 10*3/uL (ref 0.0–0.5)
Eosinophils Relative: 1 %
HCT: 49.2 % (ref 39.0–52.0)
Hemoglobin: 16.8 g/dL (ref 13.0–17.0)
Immature Granulocytes: 0 %
Lymphocytes Relative: 27 %
Lymphs Abs: 2 10*3/uL (ref 0.7–4.0)
MCH: 32.9 pg (ref 26.0–34.0)
MCHC: 34.1 g/dL (ref 30.0–36.0)
MCV: 96.3 fL (ref 80.0–100.0)
Monocytes Absolute: 0.6 10*3/uL (ref 0.1–1.0)
Monocytes Relative: 8 %
Neutro Abs: 4.8 10*3/uL (ref 1.7–7.7)
Neutrophils Relative %: 64 %
Platelets: 215 10*3/uL (ref 150–400)
RBC: 5.11 MIL/uL (ref 4.22–5.81)
RDW: 13 % (ref 11.5–15.5)
WBC: 7.5 10*3/uL (ref 4.0–10.5)
nRBC: 0 % (ref 0.0–0.2)

## 2021-11-05 LAB — BASIC METABOLIC PANEL
Anion gap: 8 (ref 5–15)
BUN: 14 mg/dL (ref 6–20)
CO2: 25 mmol/L (ref 22–32)
Calcium: 9.5 mg/dL (ref 8.9–10.3)
Chloride: 105 mmol/L (ref 98–111)
Creatinine, Ser: 0.99 mg/dL (ref 0.61–1.24)
GFR, Estimated: >60 mL/min (ref >60)
Glucose, Bld: 100 mg/dL — ABNORMAL HIGH (ref 70–99)
Potassium: 4.6 mmol/L (ref 3.5–5.1)
Sodium: 138 mmol/L (ref 135–145)

## 2021-11-05 LAB — CK: Total CK: 174 U/L (ref 49–397)

## 2021-11-05 NOTE — Progress Notes (Signed)
Right lower extremity venous duplex has been completed. ?Preliminary results can be found in CV Proc through chart review.  ?Results were given to Dr. Wyvonnia Dusky. ? ?11/05/21 9:32 AM ?Brian Riddle RVT   ?

## 2021-11-05 NOTE — Discharge Instructions (Signed)
Your ultrasound is negative for blood clot.  Your labs are normal.  Keep yourself hydrated.  Use anti-inflammatories as needed for pain as well as ice and elevation.  Follow-up with your doctor.  Return to the ED with new or worsening symptoms ?

## 2021-11-05 NOTE — ED Provider Notes (Signed)
?[facility]PT ?Provider Note ? ? ?CSN: [id number] ?Arrival date & time: 11/05/21  0818 ? ?  ? ?History ? ?Chief Complaint  ?Patient presents with  ? Leg Pain  ? ? ?Brian Riddle is a 46 y.o. male. ? ?Patient with 2 days of right leg pain with cramping, feeling tight and "feeling bruised".  Denies any trauma.  He was traveling in [place] last week and stated his right leg was swollen during that time but has since improved.  He returned to the [place] 6 days ago and noticed pain increasing to his leg over the past 2 days.  He called the nurse line last night and was referred to the ED for an ultrasound.  States he travels for work often.  Has never had a blood clot however.  Most of his pain is to his right posterior calf that extends past the knee and he feels some of the veins in his leg are bulging more than usual.  Denies any numbness or tingling.  Denies any chest pain or shortness of breath.  Denies any nausea, vomiting or fever.  No history of previous blood clot. ? ?The history is provided by the patient.  ?Leg Pain ?Associated symptoms: no fever   ? ?  ? ?Home Medications ?Prior to Admission medications   ?Medication Sig Start Date End Date Taking? Authorizing Provider  ?Ascorbic Acid (VITAMIN C) 1000 MG tablet Take 1,000 mg by mouth daily.    [provider]  ?aspirin EC 81 MG tablet Take 81 mg by mouth daily. Swallow whole.    [provider]  ?cetirizine (ZYRTEC) 10 MG tablet Take 10 mg by mouth daily.    [provider]  ?Cholecalciferol (VITAMIN D3 PO) Take 1 tablet by mouth daily.    [provider]  ?clonazePAM (KLONOPIN) 0.5 MG tablet Take 0.5 mg by mouth daily as needed for anxiety. 09/29/20   [provider]  ?Cyanocobalamin (VITAMIN B-12) 5000 MCG SUBL Place 1 tablet under the tongue daily in the afternoon.    [provider]  ?cyclobenzaprine (FLEXERIL) 5 MG tablet Take 5-10 mg by mouth daily as needed. 10/19/21    [provider]  ?doxycycline (VIBRAMYCIN) 100 MG capsule Take 100 mg by mouth 2 (two) times daily as needed (for pilondal disease). 08/08/21   [provider]  ?emtricitabine-tenofovir AF (DESCOVY) 200-25 MG tablet Take 1 tablet by mouth daily.    [provider]  ?ibuprofen (ADVIL,MOTRIN) 200 MG tablet Take 800 mg by mouth 2 (two) times daily as needed for headache (pain).    [provider]  ?Multiple Vitamin (MULTIVITAMIN WITH MINERALS) TABS Take 1 tablet by mouth daily. Men's multiple vitamin    [provider]  ?Omeprazole-Sodium Bicarbonate (ZEGERID) 20-1100 MG CAPS capsule Take 1 capsule by mouth daily as needed (heart burn).     [provider]  ?PSYLLIUM PO Take 1-2 capsules by mouth See admin instructions. Take 2 capsules by mouth with lunch and 1 capsule with supper    [provider]  ?TURMERIC PO Take 1-2 capsules by mouth See admin instructions. Take 2 capsules by mouth with lunch and 1 capsule with supper    [provider]  ?vardenafil (LEVITRA) 20 MG tablet Take 20 mg by mouth daily as needed for erectile dysfunction. 11/20/20   [provider]  ?   ? ?Allergies    ?Penicillins, Bee venom, and Metronidazole   ? ?Review of Systems   ?  Review of Systems  ?Constitutional:  Negative for activity change, appetite change and fever.  ?HENT:  Negative for congestion and rhinorrhea.   ?Respiratory:  Negative for cough, chest tightness and shortness of breath.   ?Cardiovascular:  Negative for chest pain.  ?Gastrointestinal:  Negative for abdominal pain, nausea and vomiting.  ?Genitourinary:  Negative for dysuria and hematuria.  ?Musculoskeletal:  Positive for arthralgias and myalgias.  ?Skin:  Negative for rash.  ?Neurological:  Negative for dizziness, weakness and headaches.  ? all other systems are negative except as noted in the HPI and PMH.  ? ?Physical Exam ?Updated Vital Signs ?BP (!) 136/96 (BP Location: Left Arm)   Pulse  88   Temp 98.2 ?F (36.8 ?C) (Oral)   Resp 16   Ht '5\' 9"'$  (1.753 m)   Wt 108.9 kg   SpO2 97%   BMI 35.44 kg/m?  ?Physical Exam ?Vitals and nursing note reviewed.  ?Constitutional:   ?   General: He is not in acute distress. ?   Appearance: He is well-developed.  ?HENT:  ?   Head: Normocephalic and atraumatic.  ?   Mouth/Throat:  ?   Pharynx: No oropharyngeal exudate.  ?Eyes:  ?   Conjunctiva/sclera: Conjunctivae normal.  ?   Pupils: Pupils are equal, round, and reactive to light.  ?Neck:  ?   Comments: No meningismus. ?Cardiovascular:  ?   Rate and Rhythm: Normal rate and regular rhythm.  ?   Heart sounds: Normal heart sounds. No murmur heard. ?Pulmonary:  ?   Effort: Pulmonary effort is normal. No respiratory distress.  ?   Breath sounds: Normal breath sounds.  ?Chest:  ?   Chest wall: No tenderness.  ?Abdominal:  ?   Palpations: Abdomen is soft.  ?   Tenderness: There is no abdominal tenderness. There is no guarding or rebound.  ?Musculoskeletal:     ?   General: Tenderness present. Normal range of motion.  ?   Cervical back: Normal range of motion and neck supple.  ?   Comments: No obvious swelling of right leg.  Multiple varicose veins.  No palpable cords.  Intact DP and PT pulse.  Compartments soft.  Ankle and knee range of motion intact  ?Skin: ?   General: Skin is warm.  ?Neurological:  ?   Mental Status: He is alert and oriented to person, place, and time.  ?   Cranial Nerves: No cranial nerve deficit.  ?   Motor: No abnormal muscle tone.  ?   Coordination: Coordination normal.  ?   Comments:  5/5 strength throughout. CN 2-12 intact.Equal grip strength.   ?Psychiatric:     ?   Behavior: Behavior normal.  ? ? ?ED Results / Procedures / Treatments   ?Labs ?(all labs ordered are listed, but only abnormal results are displayed) ?Labs Reviewed  ?BASIC METABOLIC PANEL - Abnormal; Notable for the following components:  ?    Result Value  ? Glucose, Bld 100 (*)   ? All other components within normal limits  ?CBC  WITH DIFFERENTIAL/PLATELET  ?CK  ? ? ?EKG ?None ? ?Radiology ?VAS [place] LOWER EXTREMITY VENOUS (DVT) (ONLY [facility] & [facility]) ? ?Result Date: 11/05/2021 ? Lower Venous DVT Study Patient Name:  HO PARISI Millirons  Date of Exam:   11/05/2021 Medical Rec #: [id number]         Accession #:    [id number] Date of Birth: 09/02/1975         Patient Gender: M Patient  Age:   53 years Exam Location:  [facility] Procedure:      VAS [place] LOWER EXTREMITY VENOUS (DVT) Referring Phys: Ezequiel Essex --------------------------------------------------------------------------------  Indications: Pain.  Risk Factors: None identified. Comparison Study: No prior studies. Performing Technologist: Oliver Hum RVT  Examination Guidelines: A complete evaluation includes B-mode imaging, spectral Doppler, color Doppler, and power Doppler as needed of all accessible portions of each vessel. Bilateral testing is considered an integral part of a complete examination. Limited examinations for reoccurring indications may be performed as noted. The reflux portion of the exam is performed with the patient in reverse Trendelenburg.  +---------+---------------+---------+-----------+----------+--------------+ RIGHT    CompressibilityPhasicitySpontaneityPropertiesThrombus Aging +---------+---------------+---------+-----------+----------+--------------+ CFV      Full           Yes      Yes                                 +---------+---------------+---------+-----------+----------+--------------+ SFJ      Full                                                        +---------+---------------+---------+-----------+----------+--------------+ FV Prox  Full                                                        +---------+---------------+---------+-----------+----------+--------------+ FV Mid   Full                                                        +---------+---------------+---------+-----------+----------+--------------+ FV  DistalFull                                                        +---------+---------------+---------+-----------+----------+--------------+ PFV      Full                                                        +---------+---------------+--------

## 2021-11-05 NOTE — ED Triage Notes (Signed)
Patient reports that he began having swelling of the right lower leg a week ago while in [place]. Patient states the swelling has decreased, but is now having pain in the calf that extends to the posterior knee. ?

## 2021-11-05 NOTE — ED Notes (Signed)
An After Visit Summary was printed and given to the patient. Discharge instructions given and no further questions at this time.  

## 2022-01-15 DIAGNOSIS — Z114 Encounter for screening for human immunodeficiency virus [HIV]: Secondary | ICD-10-CM | POA: Diagnosis not present

## 2022-01-25 DIAGNOSIS — I10 Essential (primary) hypertension: Secondary | ICD-10-CM | POA: Diagnosis not present

## 2022-01-25 DIAGNOSIS — M79671 Pain in right foot: Secondary | ICD-10-CM | POA: Diagnosis not present

## 2022-01-25 DIAGNOSIS — E782 Mixed hyperlipidemia: Secondary | ICD-10-CM | POA: Diagnosis not present

## 2022-01-25 DIAGNOSIS — F418 Other specified anxiety disorders: Secondary | ICD-10-CM | POA: Diagnosis not present

## 2022-01-25 DIAGNOSIS — F419 Anxiety disorder, unspecified: Secondary | ICD-10-CM | POA: Diagnosis not present

## 2022-04-16 DIAGNOSIS — F419 Anxiety disorder, unspecified: Secondary | ICD-10-CM | POA: Diagnosis not present

## 2022-04-16 DIAGNOSIS — R7303 Prediabetes: Secondary | ICD-10-CM | POA: Diagnosis not present

## 2022-04-16 DIAGNOSIS — Z114 Encounter for screening for human immunodeficiency virus [HIV]: Secondary | ICD-10-CM | POA: Diagnosis not present

## 2022-04-16 DIAGNOSIS — E782 Mixed hyperlipidemia: Secondary | ICD-10-CM | POA: Diagnosis not present

## 2022-04-23 DIAGNOSIS — F418 Other specified anxiety disorders: Secondary | ICD-10-CM | POA: Diagnosis not present

## 2022-04-23 DIAGNOSIS — E782 Mixed hyperlipidemia: Secondary | ICD-10-CM | POA: Diagnosis not present

## 2022-04-23 DIAGNOSIS — I10 Essential (primary) hypertension: Secondary | ICD-10-CM | POA: Diagnosis not present

## 2022-04-23 DIAGNOSIS — F419 Anxiety disorder, unspecified: Secondary | ICD-10-CM | POA: Diagnosis not present

## 2022-04-26 ENCOUNTER — Other Ambulatory Visit: Payer: Self-pay

## 2022-04-26 ENCOUNTER — Other Ambulatory Visit (HOSPITAL_COMMUNITY): Payer: Self-pay

## 2022-04-26 DIAGNOSIS — E782 Mixed hyperlipidemia: Secondary | ICD-10-CM

## 2022-05-21 ENCOUNTER — Ambulatory Visit (HOSPITAL_COMMUNITY)
Admission: RE | Admit: 2022-05-21 | Discharge: 2022-05-21 | Disposition: A | Discharge status: Home / Self Care | Payer: Self-pay | Source: Ambulatory Visit

## 2022-05-21 DIAGNOSIS — E782 Mixed hyperlipidemia: Secondary | ICD-10-CM | POA: Insufficient documentation

## 2022-05-31 DIAGNOSIS — Z23 Encounter for immunization: Secondary | ICD-10-CM | POA: Diagnosis not present

## 2022-06-23 DIAGNOSIS — Z23 Encounter for immunization: Secondary | ICD-10-CM | POA: Diagnosis not present

## 2022-07-16 DIAGNOSIS — F419 Anxiety disorder, unspecified: Secondary | ICD-10-CM | POA: Diagnosis not present

## 2022-07-16 DIAGNOSIS — Z125 Encounter for screening for malignant neoplasm of prostate: Secondary | ICD-10-CM | POA: Diagnosis not present

## 2022-07-16 DIAGNOSIS — R7303 Prediabetes: Secondary | ICD-10-CM | POA: Diagnosis not present

## 2022-07-16 DIAGNOSIS — Z0000 Encounter for general adult medical examination without abnormal findings: Secondary | ICD-10-CM | POA: Diagnosis not present

## 2022-07-16 DIAGNOSIS — E782 Mixed hyperlipidemia: Secondary | ICD-10-CM | POA: Diagnosis not present

## 2022-07-16 DIAGNOSIS — E039 Hypothyroidism, unspecified: Secondary | ICD-10-CM | POA: Diagnosis not present

## 2022-07-16 DIAGNOSIS — Z114 Encounter for screening for human immunodeficiency virus [HIV]: Secondary | ICD-10-CM | POA: Diagnosis not present

## 2022-07-26 DIAGNOSIS — F419 Anxiety disorder, unspecified: Secondary | ICD-10-CM | POA: Diagnosis not present

## 2022-07-26 DIAGNOSIS — Z0000 Encounter for general adult medical examination without abnormal findings: Secondary | ICD-10-CM | POA: Diagnosis not present

## 2022-07-26 DIAGNOSIS — N529 Male erectile dysfunction, unspecified: Secondary | ICD-10-CM | POA: Diagnosis not present

## 2022-07-26 DIAGNOSIS — R5383 Other fatigue: Secondary | ICD-10-CM | POA: Diagnosis not present

## 2022-07-26 DIAGNOSIS — R7303 Prediabetes: Secondary | ICD-10-CM | POA: Diagnosis not present

## 2022-07-26 DIAGNOSIS — E782 Mixed hyperlipidemia: Secondary | ICD-10-CM | POA: Diagnosis not present

## 2022-08-01 DIAGNOSIS — J0140 Acute pansinusitis, unspecified: Secondary | ICD-10-CM | POA: Diagnosis not present

## 2022-08-01 DIAGNOSIS — Z1152 Encounter for screening for COVID-19: Secondary | ICD-10-CM | POA: Diagnosis not present

## 2022-08-01 DIAGNOSIS — H66002 Acute suppurative otitis media without spontaneous rupture of ear drum, left ear: Secondary | ICD-10-CM | POA: Diagnosis not present

## 2022-09-06 DIAGNOSIS — E291 Testicular hypofunction: Secondary | ICD-10-CM | POA: Diagnosis not present

## 2022-10-29 DIAGNOSIS — E291 Testicular hypofunction: Secondary | ICD-10-CM | POA: Diagnosis not present

## 2022-11-03 DIAGNOSIS — I10 Essential (primary) hypertension: Secondary | ICD-10-CM | POA: Diagnosis not present

## 2022-11-03 DIAGNOSIS — Z113 Encounter for screening for infections with a predominantly sexual mode of transmission: Secondary | ICD-10-CM | POA: Diagnosis not present

## 2022-11-03 DIAGNOSIS — E782 Mixed hyperlipidemia: Secondary | ICD-10-CM | POA: Diagnosis not present

## 2022-11-05 DIAGNOSIS — E291 Testicular hypofunction: Secondary | ICD-10-CM | POA: Diagnosis not present

## 2022-11-10 DIAGNOSIS — F419 Anxiety disorder, unspecified: Secondary | ICD-10-CM | POA: Diagnosis not present

## 2022-11-10 DIAGNOSIS — E782 Mixed hyperlipidemia: Secondary | ICD-10-CM | POA: Diagnosis not present

## 2022-11-10 DIAGNOSIS — R7303 Prediabetes: Secondary | ICD-10-CM | POA: Diagnosis not present

## 2022-11-10 DIAGNOSIS — N529 Male erectile dysfunction, unspecified: Secondary | ICD-10-CM | POA: Diagnosis not present

## 2022-12-02 DIAGNOSIS — R0989 Other specified symptoms and signs involving the circulatory and respiratory systems: Secondary | ICD-10-CM | POA: Diagnosis not present

## 2022-12-02 DIAGNOSIS — R051 Acute cough: Secondary | ICD-10-CM | POA: Diagnosis not present

## 2023-02-08 DIAGNOSIS — Z113 Encounter for screening for infections with a predominantly sexual mode of transmission: Secondary | ICD-10-CM | POA: Diagnosis not present

## 2023-03-24 DIAGNOSIS — F411 Generalized anxiety disorder: Secondary | ICD-10-CM | POA: Diagnosis not present

## 2023-04-08 DIAGNOSIS — F411 Generalized anxiety disorder: Secondary | ICD-10-CM | POA: Diagnosis not present

## 2023-04-13 DIAGNOSIS — F411 Generalized anxiety disorder: Secondary | ICD-10-CM | POA: Diagnosis not present

## 2023-05-06 DIAGNOSIS — J208 Acute bronchitis due to other specified organisms: Secondary | ICD-10-CM | POA: Diagnosis not present

## 2023-05-10 DIAGNOSIS — F411 Generalized anxiety disorder: Secondary | ICD-10-CM | POA: Diagnosis not present

## 2023-05-13 DIAGNOSIS — E782 Mixed hyperlipidemia: Secondary | ICD-10-CM | POA: Diagnosis not present

## 2023-05-13 DIAGNOSIS — I10 Essential (primary) hypertension: Secondary | ICD-10-CM | POA: Diagnosis not present

## 2023-05-13 DIAGNOSIS — Z114 Encounter for screening for human immunodeficiency virus [HIV]: Secondary | ICD-10-CM | POA: Diagnosis not present

## 2023-05-20 DIAGNOSIS — F419 Anxiety disorder, unspecified: Secondary | ICD-10-CM | POA: Diagnosis not present

## 2023-05-20 DIAGNOSIS — N529 Male erectile dysfunction, unspecified: Secondary | ICD-10-CM | POA: Diagnosis not present

## 2023-05-20 DIAGNOSIS — E782 Mixed hyperlipidemia: Secondary | ICD-10-CM | POA: Diagnosis not present

## 2023-05-20 DIAGNOSIS — R7303 Prediabetes: Secondary | ICD-10-CM | POA: Diagnosis not present

## 2023-06-02 DIAGNOSIS — J029 Acute pharyngitis, unspecified: Secondary | ICD-10-CM | POA: Diagnosis not present

## 2023-06-04 DIAGNOSIS — J029 Acute pharyngitis, unspecified: Secondary | ICD-10-CM | POA: Diagnosis not present

## 2023-06-04 DIAGNOSIS — R0981 Nasal congestion: Secondary | ICD-10-CM | POA: Diagnosis not present

## 2023-06-04 DIAGNOSIS — H66003 Acute suppurative otitis media without spontaneous rupture of ear drum, bilateral: Secondary | ICD-10-CM | POA: Diagnosis not present

## 2023-06-10 DIAGNOSIS — F411 Generalized anxiety disorder: Secondary | ICD-10-CM | POA: Diagnosis not present

## 2023-07-07 DIAGNOSIS — E291 Testicular hypofunction: Secondary | ICD-10-CM | POA: Diagnosis not present

## 2023-07-19 DIAGNOSIS — F411 Generalized anxiety disorder: Secondary | ICD-10-CM | POA: Diagnosis not present

## 2023-08-16 DIAGNOSIS — F411 Generalized anxiety disorder: Secondary | ICD-10-CM | POA: Diagnosis not present

## 2023-08-17 DIAGNOSIS — Z113 Encounter for screening for infections with a predominantly sexual mode of transmission: Secondary | ICD-10-CM | POA: Diagnosis not present

## 2023-08-17 DIAGNOSIS — R7303 Prediabetes: Secondary | ICD-10-CM | POA: Diagnosis not present

## 2023-08-17 DIAGNOSIS — E782 Mixed hyperlipidemia: Secondary | ICD-10-CM | POA: Diagnosis not present

## 2023-08-24 DIAGNOSIS — F330 Major depressive disorder, recurrent, mild: Secondary | ICD-10-CM | POA: Diagnosis not present

## 2023-08-24 DIAGNOSIS — K5792 Diverticulitis of intestine, part unspecified, without perforation or abscess without bleeding: Secondary | ICD-10-CM | POA: Diagnosis not present

## 2023-08-24 DIAGNOSIS — Z9889 Other specified postprocedural states: Secondary | ICD-10-CM | POA: Diagnosis not present

## 2023-08-24 DIAGNOSIS — E782 Mixed hyperlipidemia: Secondary | ICD-10-CM | POA: Diagnosis not present

## 2023-08-30 DIAGNOSIS — F411 Generalized anxiety disorder: Secondary | ICD-10-CM | POA: Diagnosis not present

## 2023-09-27 DIAGNOSIS — F411 Generalized anxiety disorder: Secondary | ICD-10-CM | POA: Diagnosis not present

## 2023-11-23 DIAGNOSIS — Z113 Encounter for screening for infections with a predominantly sexual mode of transmission: Secondary | ICD-10-CM | POA: Diagnosis not present

## 2023-11-23 DIAGNOSIS — R7303 Prediabetes: Secondary | ICD-10-CM | POA: Diagnosis not present

## 2023-11-23 DIAGNOSIS — E782 Mixed hyperlipidemia: Secondary | ICD-10-CM | POA: Diagnosis not present

## 2023-11-30 DIAGNOSIS — F419 Anxiety disorder, unspecified: Secondary | ICD-10-CM | POA: Diagnosis not present

## 2023-11-30 DIAGNOSIS — E782 Mixed hyperlipidemia: Secondary | ICD-10-CM | POA: Diagnosis not present

## 2023-11-30 DIAGNOSIS — Z0000 Encounter for general adult medical examination without abnormal findings: Secondary | ICD-10-CM | POA: Diagnosis not present

## 2023-11-30 DIAGNOSIS — R7303 Prediabetes: Secondary | ICD-10-CM | POA: Diagnosis not present

## 2024-01-06 DIAGNOSIS — L243 Irritant contact dermatitis due to cosmetics: Secondary | ICD-10-CM | POA: Diagnosis not present

## 2024-01-06 DIAGNOSIS — L509 Urticaria, unspecified: Secondary | ICD-10-CM | POA: Diagnosis not present

## 2024-03-01 DIAGNOSIS — Z114 Encounter for screening for human immunodeficiency virus [HIV]: Secondary | ICD-10-CM | POA: Diagnosis not present

## 2024-03-01 DIAGNOSIS — I10 Essential (primary) hypertension: Secondary | ICD-10-CM | POA: Diagnosis not present

## 2024-03-01 DIAGNOSIS — E782 Mixed hyperlipidemia: Secondary | ICD-10-CM | POA: Diagnosis not present

## 2024-03-01 DIAGNOSIS — R7303 Prediabetes: Secondary | ICD-10-CM | POA: Diagnosis not present

## 2024-03-08 DIAGNOSIS — R7303 Prediabetes: Secondary | ICD-10-CM | POA: Diagnosis not present

## 2024-03-08 DIAGNOSIS — E782 Mixed hyperlipidemia: Secondary | ICD-10-CM | POA: Diagnosis not present

## 2024-03-08 DIAGNOSIS — N529 Male erectile dysfunction, unspecified: Secondary | ICD-10-CM | POA: Diagnosis not present

## 2024-03-08 DIAGNOSIS — F419 Anxiety disorder, unspecified: Secondary | ICD-10-CM | POA: Diagnosis not present

## 2024-04-05 DIAGNOSIS — L820 Inflamed seborrheic keratosis: Secondary | ICD-10-CM | POA: Diagnosis not present

## 2024-04-05 DIAGNOSIS — L538 Other specified erythematous conditions: Secondary | ICD-10-CM | POA: Diagnosis not present

## 2024-04-05 DIAGNOSIS — L821 Other seborrheic keratosis: Secondary | ICD-10-CM | POA: Diagnosis not present

## 2024-04-05 DIAGNOSIS — L814 Other melanin hyperpigmentation: Secondary | ICD-10-CM | POA: Diagnosis not present

## 2024-04-05 DIAGNOSIS — D1801 Hemangioma of skin and subcutaneous tissue: Secondary | ICD-10-CM | POA: Diagnosis not present

## 2024-04-23 DIAGNOSIS — R059 Cough, unspecified: Secondary | ICD-10-CM | POA: Diagnosis not present

## 2024-04-23 DIAGNOSIS — R0981 Nasal congestion: Secondary | ICD-10-CM | POA: Diagnosis not present

## 2024-04-23 DIAGNOSIS — H6690 Otitis media, unspecified, unspecified ear: Secondary | ICD-10-CM | POA: Diagnosis not present

## 2024-05-31 DIAGNOSIS — Z113 Encounter for screening for infections with a predominantly sexual mode of transmission: Secondary | ICD-10-CM | POA: Diagnosis not present

## 2024-05-31 DIAGNOSIS — E782 Mixed hyperlipidemia: Secondary | ICD-10-CM | POA: Diagnosis not present

## 2024-05-31 DIAGNOSIS — R7303 Prediabetes: Secondary | ICD-10-CM | POA: Diagnosis not present

## 2024-05-31 DIAGNOSIS — R41840 Attention and concentration deficit: Secondary | ICD-10-CM | POA: Diagnosis not present

## 2024-06-08 DIAGNOSIS — N529 Male erectile dysfunction, unspecified: Secondary | ICD-10-CM | POA: Diagnosis not present

## 2024-06-08 DIAGNOSIS — Z23 Encounter for immunization: Secondary | ICD-10-CM | POA: Diagnosis not present

## 2024-06-08 DIAGNOSIS — R41840 Attention and concentration deficit: Secondary | ICD-10-CM | POA: Diagnosis not present

## 2024-06-08 DIAGNOSIS — Z79899 Other long term (current) drug therapy: Secondary | ICD-10-CM | POA: Diagnosis not present

## 2024-06-08 DIAGNOSIS — E785 Hyperlipidemia, unspecified: Secondary | ICD-10-CM | POA: Diagnosis not present

## 2024-06-08 DIAGNOSIS — R7303 Prediabetes: Secondary | ICD-10-CM | POA: Diagnosis not present

## 2024-06-08 DIAGNOSIS — F419 Anxiety disorder, unspecified: Secondary | ICD-10-CM | POA: Diagnosis not present
